# Patient Record
Sex: Female | Born: 1975
Health system: Southern US, Community
[De-identification: ages and names within clinical notes are randomized; demographics above are authoritative.]

## PROBLEM LIST (undated history)

## (undated) DIAGNOSIS — E119 Type 2 diabetes mellitus without complications: Secondary | ICD-10-CM

## (undated) DIAGNOSIS — M419 Scoliosis, unspecified: Secondary | ICD-10-CM

## (undated) HISTORY — DX: Type 2 diabetes mellitus without complications: E11.9

## (undated) HISTORY — DX: Scoliosis, unspecified: M41.9

---

## 2009-03-20 DIAGNOSIS — E785 Hyperlipidemia, unspecified: Secondary | ICD-10-CM | POA: Insufficient documentation

## 2009-03-20 DIAGNOSIS — I10 Essential (primary) hypertension: Secondary | ICD-10-CM | POA: Insufficient documentation

## 2011-03-24 DIAGNOSIS — B351 Tinea unguium: Secondary | ICD-10-CM | POA: Diagnosis not present

## 2011-03-24 DIAGNOSIS — M2040 Other hammer toe(s) (acquired), unspecified foot: Secondary | ICD-10-CM | POA: Diagnosis not present

## 2011-03-24 DIAGNOSIS — M79609 Pain in unspecified limb: Secondary | ICD-10-CM | POA: Diagnosis not present

## 2011-03-25 DIAGNOSIS — N058 Unspecified nephritic syndrome with other morphologic changes: Secondary | ICD-10-CM | POA: Diagnosis not present

## 2011-03-25 DIAGNOSIS — I10 Essential (primary) hypertension: Secondary | ICD-10-CM | POA: Diagnosis not present

## 2011-03-25 DIAGNOSIS — E785 Hyperlipidemia, unspecified: Secondary | ICD-10-CM | POA: Diagnosis not present

## 2011-03-25 DIAGNOSIS — G43709 Chronic migraine without aura, not intractable, without status migrainosus: Secondary | ICD-10-CM | POA: Insufficient documentation

## 2011-05-30 DIAGNOSIS — Z3040 Encounter for surveillance of contraceptives, unspecified: Secondary | ICD-10-CM | POA: Diagnosis not present

## 2011-05-31 DIAGNOSIS — N058 Unspecified nephritic syndrome with other morphologic changes: Secondary | ICD-10-CM | POA: Diagnosis not present

## 2011-05-31 DIAGNOSIS — R51 Headache: Secondary | ICD-10-CM | POA: Diagnosis not present

## 2011-05-31 DIAGNOSIS — E785 Hyperlipidemia, unspecified: Secondary | ICD-10-CM | POA: Diagnosis not present

## 2011-07-07 DIAGNOSIS — E1149 Type 2 diabetes mellitus with other diabetic neurological complication: Secondary | ICD-10-CM | POA: Diagnosis not present

## 2011-07-07 DIAGNOSIS — Q828 Other specified congenital malformations of skin: Secondary | ICD-10-CM | POA: Diagnosis not present

## 2011-07-07 DIAGNOSIS — L608 Other nail disorders: Secondary | ICD-10-CM | POA: Diagnosis not present

## 2011-08-18 DIAGNOSIS — Z3040 Encounter for surveillance of contraceptives, unspecified: Secondary | ICD-10-CM | POA: Diagnosis not present

## 2011-09-05 DIAGNOSIS — E1129 Type 2 diabetes mellitus with other diabetic kidney complication: Secondary | ICD-10-CM | POA: Diagnosis not present

## 2011-09-05 DIAGNOSIS — R51 Headache: Secondary | ICD-10-CM | POA: Diagnosis not present

## 2011-09-05 DIAGNOSIS — F419 Anxiety disorder, unspecified: Secondary | ICD-10-CM | POA: Insufficient documentation

## 2011-09-05 DIAGNOSIS — N058 Unspecified nephritic syndrome with other morphologic changes: Secondary | ICD-10-CM | POA: Diagnosis not present

## 2011-09-05 DIAGNOSIS — Z23 Encounter for immunization: Secondary | ICD-10-CM | POA: Diagnosis not present

## 2011-09-05 DIAGNOSIS — E785 Hyperlipidemia, unspecified: Secondary | ICD-10-CM | POA: Diagnosis not present

## 2011-11-11 DIAGNOSIS — Z3040 Encounter for surveillance of contraceptives, unspecified: Secondary | ICD-10-CM | POA: Diagnosis not present

## 2012-01-12 DIAGNOSIS — H53009 Unspecified amblyopia, unspecified eye: Secondary | ICD-10-CM | POA: Diagnosis not present

## 2012-01-12 DIAGNOSIS — E119 Type 2 diabetes mellitus without complications: Secondary | ICD-10-CM | POA: Diagnosis not present

## 2012-01-22 DIAGNOSIS — B86 Scabies: Secondary | ICD-10-CM | POA: Diagnosis not present

## 2012-02-08 DIAGNOSIS — Z23 Encounter for immunization: Secondary | ICD-10-CM | POA: Diagnosis not present

## 2012-02-08 DIAGNOSIS — Z3040 Encounter for surveillance of contraceptives, unspecified: Secondary | ICD-10-CM | POA: Diagnosis not present

## 2012-02-26 DIAGNOSIS — J309 Allergic rhinitis, unspecified: Secondary | ICD-10-CM | POA: Diagnosis not present

## 2012-03-29 DIAGNOSIS — Q828 Other specified congenital malformations of skin: Secondary | ICD-10-CM | POA: Diagnosis not present

## 2012-03-29 DIAGNOSIS — L608 Other nail disorders: Secondary | ICD-10-CM | POA: Diagnosis not present

## 2012-03-29 DIAGNOSIS — E1149 Type 2 diabetes mellitus with other diabetic neurological complication: Secondary | ICD-10-CM | POA: Diagnosis not present

## 2012-04-03 DIAGNOSIS — I10 Essential (primary) hypertension: Secondary | ICD-10-CM | POA: Diagnosis not present

## 2012-04-03 DIAGNOSIS — F79 Unspecified intellectual disabilities: Secondary | ICD-10-CM | POA: Diagnosis not present

## 2012-04-03 DIAGNOSIS — E119 Type 2 diabetes mellitus without complications: Secondary | ICD-10-CM | POA: Diagnosis not present

## 2012-04-03 DIAGNOSIS — E1129 Type 2 diabetes mellitus with other diabetic kidney complication: Secondary | ICD-10-CM | POA: Diagnosis not present

## 2012-04-03 DIAGNOSIS — E785 Hyperlipidemia, unspecified: Secondary | ICD-10-CM | POA: Diagnosis not present

## 2012-06-14 DIAGNOSIS — I10 Essential (primary) hypertension: Secondary | ICD-10-CM | POA: Diagnosis not present

## 2012-06-14 DIAGNOSIS — N058 Unspecified nephritic syndrome with other morphologic changes: Secondary | ICD-10-CM | POA: Diagnosis not present

## 2012-06-14 DIAGNOSIS — E1129 Type 2 diabetes mellitus with other diabetic kidney complication: Secondary | ICD-10-CM | POA: Diagnosis not present

## 2012-06-21 DIAGNOSIS — Q828 Other specified congenital malformations of skin: Secondary | ICD-10-CM | POA: Diagnosis not present

## 2012-06-21 DIAGNOSIS — L608 Other nail disorders: Secondary | ICD-10-CM | POA: Diagnosis not present

## 2012-06-21 DIAGNOSIS — E1149 Type 2 diabetes mellitus with other diabetic neurological complication: Secondary | ICD-10-CM | POA: Diagnosis not present

## 2012-07-05 DIAGNOSIS — H6120 Impacted cerumen, unspecified ear: Secondary | ICD-10-CM | POA: Diagnosis not present

## 2012-07-05 DIAGNOSIS — M25519 Pain in unspecified shoulder: Secondary | ICD-10-CM | POA: Diagnosis not present

## 2012-07-26 DIAGNOSIS — I10 Essential (primary) hypertension: Secondary | ICD-10-CM | POA: Diagnosis not present

## 2012-07-26 DIAGNOSIS — E1129 Type 2 diabetes mellitus with other diabetic kidney complication: Secondary | ICD-10-CM | POA: Diagnosis not present

## 2012-07-26 DIAGNOSIS — N058 Unspecified nephritic syndrome with other morphologic changes: Secondary | ICD-10-CM | POA: Diagnosis not present

## 2012-08-24 DIAGNOSIS — Z01419 Encounter for gynecological examination (general) (routine) without abnormal findings: Secondary | ICD-10-CM | POA: Diagnosis not present

## 2012-09-13 DIAGNOSIS — E1129 Type 2 diabetes mellitus with other diabetic kidney complication: Secondary | ICD-10-CM | POA: Diagnosis not present

## 2012-09-13 DIAGNOSIS — I10 Essential (primary) hypertension: Secondary | ICD-10-CM | POA: Diagnosis not present

## 2012-09-13 DIAGNOSIS — N058 Unspecified nephritic syndrome with other morphologic changes: Secondary | ICD-10-CM | POA: Diagnosis not present

## 2012-09-13 DIAGNOSIS — Z6837 Body mass index (BMI) 37.0-37.9, adult: Secondary | ICD-10-CM | POA: Diagnosis not present

## 2012-09-24 DIAGNOSIS — Z3040 Encounter for surveillance of contraceptives, unspecified: Secondary | ICD-10-CM | POA: Diagnosis not present

## 2012-10-09 ENCOUNTER — Other Ambulatory Visit: Payer: Self-pay | Admitting: Endocrinology

## 2012-10-09 DIAGNOSIS — N644 Mastodynia: Secondary | ICD-10-CM

## 2012-10-09 DIAGNOSIS — Z6837 Body mass index (BMI) 37.0-37.9, adult: Secondary | ICD-10-CM | POA: Diagnosis not present

## 2012-10-09 DIAGNOSIS — L84 Corns and callosities: Secondary | ICD-10-CM | POA: Diagnosis not present

## 2012-10-09 DIAGNOSIS — E1129 Type 2 diabetes mellitus with other diabetic kidney complication: Secondary | ICD-10-CM | POA: Diagnosis not present

## 2012-10-09 DIAGNOSIS — N058 Unspecified nephritic syndrome with other morphologic changes: Secondary | ICD-10-CM | POA: Diagnosis not present

## 2012-10-09 DIAGNOSIS — E785 Hyperlipidemia, unspecified: Secondary | ICD-10-CM | POA: Diagnosis not present

## 2012-10-09 DIAGNOSIS — G43009 Migraine without aura, not intractable, without status migrainosus: Secondary | ICD-10-CM | POA: Diagnosis not present

## 2012-10-09 DIAGNOSIS — I10 Essential (primary) hypertension: Secondary | ICD-10-CM | POA: Diagnosis not present

## 2012-10-24 ENCOUNTER — Ambulatory Visit
Admission: RE | Admit: 2012-10-24 | Discharge: 2012-10-24 | Disposition: A | Discharge status: Home / Self Care | Payer: Medicare Other | Source: Ambulatory Visit | Attending: Endocrinology | Admitting: Endocrinology

## 2012-10-24 DIAGNOSIS — N644 Mastodynia: Secondary | ICD-10-CM

## 2012-11-14 DIAGNOSIS — Z23 Encounter for immunization: Secondary | ICD-10-CM | POA: Diagnosis not present

## 2012-11-14 DIAGNOSIS — G43009 Migraine without aura, not intractable, without status migrainosus: Secondary | ICD-10-CM | POA: Diagnosis not present

## 2012-11-14 DIAGNOSIS — E1129 Type 2 diabetes mellitus with other diabetic kidney complication: Secondary | ICD-10-CM | POA: Diagnosis not present

## 2012-11-14 DIAGNOSIS — I10 Essential (primary) hypertension: Secondary | ICD-10-CM | POA: Diagnosis not present

## 2012-11-14 DIAGNOSIS — N058 Unspecified nephritic syndrome with other morphologic changes: Secondary | ICD-10-CM | POA: Diagnosis not present

## 2012-12-20 DIAGNOSIS — B373 Candidiasis of vulva and vagina: Secondary | ICD-10-CM | POA: Diagnosis not present

## 2012-12-26 DIAGNOSIS — Z6836 Body mass index (BMI) 36.0-36.9, adult: Secondary | ICD-10-CM | POA: Diagnosis not present

## 2012-12-26 DIAGNOSIS — R197 Diarrhea, unspecified: Secondary | ICD-10-CM | POA: Diagnosis not present

## 2012-12-26 DIAGNOSIS — I10 Essential (primary) hypertension: Secondary | ICD-10-CM | POA: Diagnosis not present

## 2012-12-26 DIAGNOSIS — E1129 Type 2 diabetes mellitus with other diabetic kidney complication: Secondary | ICD-10-CM | POA: Diagnosis not present

## 2012-12-26 DIAGNOSIS — N058 Unspecified nephritic syndrome with other morphologic changes: Secondary | ICD-10-CM | POA: Diagnosis not present

## 2013-01-28 DIAGNOSIS — N058 Unspecified nephritic syndrome with other morphologic changes: Secondary | ICD-10-CM | POA: Diagnosis not present

## 2013-01-28 DIAGNOSIS — G43009 Migraine without aura, not intractable, without status migrainosus: Secondary | ICD-10-CM | POA: Diagnosis not present

## 2013-01-28 DIAGNOSIS — E1129 Type 2 diabetes mellitus with other diabetic kidney complication: Secondary | ICD-10-CM | POA: Diagnosis not present

## 2013-01-28 DIAGNOSIS — F341 Dysthymic disorder: Secondary | ICD-10-CM | POA: Diagnosis not present

## 2013-01-28 DIAGNOSIS — E785 Hyperlipidemia, unspecified: Secondary | ICD-10-CM | POA: Diagnosis not present

## 2013-01-28 DIAGNOSIS — I10 Essential (primary) hypertension: Secondary | ICD-10-CM | POA: Diagnosis not present

## 2013-01-28 DIAGNOSIS — L84 Corns and callosities: Secondary | ICD-10-CM | POA: Diagnosis not present

## 2013-01-28 DIAGNOSIS — F79 Unspecified intellectual disabilities: Secondary | ICD-10-CM | POA: Diagnosis not present

## 2013-03-12 DIAGNOSIS — E1129 Type 2 diabetes mellitus with other diabetic kidney complication: Secondary | ICD-10-CM | POA: Diagnosis not present

## 2013-03-12 DIAGNOSIS — N058 Unspecified nephritic syndrome with other morphologic changes: Secondary | ICD-10-CM | POA: Diagnosis not present

## 2013-03-12 DIAGNOSIS — I10 Essential (primary) hypertension: Secondary | ICD-10-CM | POA: Diagnosis not present

## 2013-03-19 DIAGNOSIS — J310 Chronic rhinitis: Secondary | ICD-10-CM | POA: Diagnosis not present

## 2013-04-11 DIAGNOSIS — E119 Type 2 diabetes mellitus without complications: Secondary | ICD-10-CM | POA: Diagnosis not present

## 2013-04-11 DIAGNOSIS — M4120 Other idiopathic scoliosis, site unspecified: Secondary | ICD-10-CM | POA: Diagnosis not present

## 2013-04-11 DIAGNOSIS — R079 Chest pain, unspecified: Secondary | ICD-10-CM | POA: Diagnosis not present

## 2013-04-16 DIAGNOSIS — R079 Chest pain, unspecified: Secondary | ICD-10-CM | POA: Diagnosis not present

## 2013-05-07 DIAGNOSIS — K5900 Constipation, unspecified: Secondary | ICD-10-CM | POA: Diagnosis not present

## 2013-05-07 DIAGNOSIS — R109 Unspecified abdominal pain: Secondary | ICD-10-CM | POA: Diagnosis not present

## 2013-05-07 DIAGNOSIS — M4120 Other idiopathic scoliosis, site unspecified: Secondary | ICD-10-CM | POA: Diagnosis not present

## 2013-05-07 DIAGNOSIS — H6120 Impacted cerumen, unspecified ear: Secondary | ICD-10-CM | POA: Diagnosis not present

## 2013-05-08 DIAGNOSIS — R142 Eructation: Secondary | ICD-10-CM | POA: Diagnosis not present

## 2013-05-08 DIAGNOSIS — R141 Gas pain: Secondary | ICD-10-CM | POA: Diagnosis not present

## 2013-05-08 DIAGNOSIS — K5900 Constipation, unspecified: Secondary | ICD-10-CM | POA: Diagnosis not present

## 2013-05-08 DIAGNOSIS — M47814 Spondylosis without myelopathy or radiculopathy, thoracic region: Secondary | ICD-10-CM | POA: Diagnosis not present

## 2013-05-08 DIAGNOSIS — M545 Low back pain, unspecified: Secondary | ICD-10-CM | POA: Diagnosis not present

## 2013-05-28 DIAGNOSIS — Z6836 Body mass index (BMI) 36.0-36.9, adult: Secondary | ICD-10-CM | POA: Diagnosis not present

## 2013-05-28 DIAGNOSIS — F341 Dysthymic disorder: Secondary | ICD-10-CM | POA: Diagnosis not present

## 2013-05-28 DIAGNOSIS — F79 Unspecified intellectual disabilities: Secondary | ICD-10-CM | POA: Diagnosis not present

## 2013-05-28 DIAGNOSIS — N058 Unspecified nephritic syndrome with other morphologic changes: Secondary | ICD-10-CM | POA: Diagnosis not present

## 2013-05-28 DIAGNOSIS — E1129 Type 2 diabetes mellitus with other diabetic kidney complication: Secondary | ICD-10-CM | POA: Diagnosis not present

## 2013-06-11 DIAGNOSIS — K5900 Constipation, unspecified: Secondary | ICD-10-CM | POA: Diagnosis not present

## 2013-06-11 DIAGNOSIS — B354 Tinea corporis: Secondary | ICD-10-CM | POA: Diagnosis not present

## 2013-06-20 ENCOUNTER — Ambulatory Visit (INDEPENDENT_AMBULATORY_CARE_PROVIDER_SITE_OTHER): Payer: Medicare Other

## 2013-06-20 VITALS — BP 124/81 | HR 96 | Resp 18

## 2013-06-20 DIAGNOSIS — E1149 Type 2 diabetes mellitus with other diabetic neurological complication: Secondary | ICD-10-CM | POA: Diagnosis not present

## 2013-06-20 DIAGNOSIS — Q828 Other specified congenital malformations of skin: Secondary | ICD-10-CM

## 2013-06-20 DIAGNOSIS — L608 Other nail disorders: Secondary | ICD-10-CM | POA: Diagnosis not present

## 2013-06-20 DIAGNOSIS — E1140 Type 2 diabetes mellitus with diabetic neuropathy, unspecified: Secondary | ICD-10-CM

## 2013-06-20 NOTE — Progress Notes (Signed)
   Subjective:    Patient ID: Sara Chung, female    DOB: 11/23/1975, 38 y.o.   MRN: [id number]  HPI Trim my nails and callus on my right foot and I need some new shoes    Review of Systems  Gastrointestinal: Positive for constipation.  All other systems reviewed and are negative.      Objective:   Physical Exam Vascular status is intact with pedal pulses palpable DP postal for PT plus one over 4 Refill time 3 seconds epicritic and proprioceptive sensations intact and symmetric bilateral diminished sensation to the toes and plantar forefoot there is diffuse keratoses sub-fifth metatarsal bilateral with punctate keratoses sub-fifth right which is debrided at this time. Patient's K. for diabetic accident shoes need replacing at this time will get authorizations were primary physician contact patient when shoe authorization received for casting and molding incising. The other wounds ulcerations mild flexible digital contractures are noted one through 5 bilateral       Assessment & Plan:  Assessment diabetes with peripheral neuropathy multiple dystrophic from criptotic nails are debrided and presence of diabetes and complications at this time return for future palliative nail care is needed also reducing keratotic lesion sub-5 right  Harriet Masson DPM

## 2013-06-20 NOTE — Patient Instructions (Signed)
Diabetes and Foot Care Diabetes may cause you to have problems because of poor blood supply (circulation) to your feet and legs. This may cause the skin on your feet to become thinner, break easier, and heal more slowly. Your skin may become dry, and the skin may peel and crack. You may also have nerve damage in your legs and feet causing decreased feeling in them. You may not notice minor injuries to your feet that could lead to infections or more serious problems. Taking care of your feet is one of the most important things you can do for yourself.  HOME CARE INSTRUCTIONS  Wear shoes at all times, even in the house. Do not go barefoot. Bare feet are easily injured.  Check your feet daily for blisters, cuts, and redness. If you cannot see the bottom of your feet, use a mirror or ask someone for help.  Wash your feet with warm water (do not use hot water) and mild soap. Then pat your feet and the areas between your toes until they are completely dry. Do not soak your feet as this can dry your skin.  Apply a moisturizing lotion or petroleum jelly (that does not contain alcohol and is unscented) to the skin on your feet and to dry, brittle toenails. Do not apply lotion between your toes.  Trim your toenails straight across. Do not dig under them or around the cuticle. File the edges of your nails with an emery board or nail file.  Do not cut corns or calluses or try to remove them with medicine.  Wear clean socks or stockings every day. Make sure they are not too tight. Do not wear knee-high stockings since they may decrease blood flow to your legs.  Wear shoes that fit properly and have enough cushioning. To break in new shoes, wear them for just a few hours a day. This prevents you from injuring your feet. Always look in your shoes before you put them on to be sure there are no objects inside.  Do not cross your legs. This may decrease the blood flow to your feet.  If you find a minor scrape,  cut, or break in the skin on your feet, keep it and the skin around it clean and dry. These areas may be cleansed with mild soap and water. Do not cleanse the area with peroxide, alcohol, or iodine.  When you remove an adhesive bandage, be sure not to damage the skin around it.  If you have a wound, look at it several times a day to make sure it is healing.  Do not use heating pads or hot water bottles. They may burn your skin. If you have lost feeling in your feet or legs, you may not know it is happening until it is too late.  Make sure your health care provider performs a complete foot exam at least annually or more often if you have foot problems. Report any cuts, sores, or bruises to your health care provider immediately. SEEK MEDICAL CARE IF:   You have an injury that is not healing.  You have cuts or breaks in the skin.  You have an ingrown nail.  You notice redness on your legs or feet.  You feel burning or tingling in your legs or feet.  You have pain or cramps in your legs and feet.  Your legs or feet are numb.  Your feet always feel cold. SEEK IMMEDIATE MEDICAL CARE IF:   There is increasing redness,   swelling, or pain in or around a wound.  There is a red line that goes up your leg.  Pus is coming from a wound.  You develop a fever or as directed by your health care provider.  You notice a bad smell coming from an ulcer or wound. Document Released: 02/19/2000 Document Revised: 10/24/2012 Document Reviewed: 07/31/2012 Exit[employer] Patient Information 2014 [employer].  

## 2013-07-05 DIAGNOSIS — J0100 Acute maxillary sinusitis, unspecified: Secondary | ICD-10-CM | POA: Diagnosis not present

## 2013-07-05 DIAGNOSIS — J309 Allergic rhinitis, unspecified: Secondary | ICD-10-CM | POA: Diagnosis not present

## 2013-08-15 DIAGNOSIS — R109 Unspecified abdominal pain: Secondary | ICD-10-CM | POA: Diagnosis not present

## 2013-08-19 DIAGNOSIS — R109 Unspecified abdominal pain: Secondary | ICD-10-CM | POA: Diagnosis not present

## 2013-08-29 DIAGNOSIS — J069 Acute upper respiratory infection, unspecified: Secondary | ICD-10-CM | POA: Diagnosis not present

## 2013-08-29 DIAGNOSIS — L988 Other specified disorders of the skin and subcutaneous tissue: Secondary | ICD-10-CM | POA: Diagnosis not present

## 2013-08-30 DIAGNOSIS — N1330 Unspecified hydronephrosis: Secondary | ICD-10-CM | POA: Diagnosis not present

## 2013-08-30 DIAGNOSIS — N390 Urinary tract infection, site not specified: Secondary | ICD-10-CM | POA: Diagnosis not present

## 2013-08-30 DIAGNOSIS — N281 Cyst of kidney, acquired: Secondary | ICD-10-CM | POA: Diagnosis not present

## 2013-09-10 DIAGNOSIS — F79 Unspecified intellectual disabilities: Secondary | ICD-10-CM | POA: Diagnosis not present

## 2013-09-10 DIAGNOSIS — E1129 Type 2 diabetes mellitus with other diabetic kidney complication: Secondary | ICD-10-CM | POA: Diagnosis not present

## 2013-09-10 DIAGNOSIS — N058 Unspecified nephritic syndrome with other morphologic changes: Secondary | ICD-10-CM | POA: Diagnosis not present

## 2013-09-10 DIAGNOSIS — Z6836 Body mass index (BMI) 36.0-36.9, adult: Secondary | ICD-10-CM | POA: Diagnosis not present

## 2013-09-11 DIAGNOSIS — N269 Renal sclerosis, unspecified: Secondary | ICD-10-CM | POA: Diagnosis not present

## 2013-09-11 DIAGNOSIS — N289 Disorder of kidney and ureter, unspecified: Secondary | ICD-10-CM | POA: Diagnosis not present

## 2013-09-19 DIAGNOSIS — E119 Type 2 diabetes mellitus without complications: Secondary | ICD-10-CM | POA: Diagnosis not present

## 2013-09-26 ENCOUNTER — Ambulatory Visit: Payer: Medicare Other

## 2013-09-27 DIAGNOSIS — R51 Headache: Secondary | ICD-10-CM | POA: Diagnosis not present

## 2013-09-27 DIAGNOSIS — L84 Corns and callosities: Secondary | ICD-10-CM | POA: Diagnosis not present

## 2013-09-27 DIAGNOSIS — E1129 Type 2 diabetes mellitus with other diabetic kidney complication: Secondary | ICD-10-CM | POA: Diagnosis not present

## 2013-09-27 DIAGNOSIS — I10 Essential (primary) hypertension: Secondary | ICD-10-CM | POA: Diagnosis not present

## 2013-09-27 DIAGNOSIS — F79 Unspecified intellectual disabilities: Secondary | ICD-10-CM | POA: Diagnosis not present

## 2013-09-27 DIAGNOSIS — E785 Hyperlipidemia, unspecified: Secondary | ICD-10-CM | POA: Diagnosis not present

## 2013-09-27 DIAGNOSIS — N058 Unspecified nephritic syndrome with other morphologic changes: Secondary | ICD-10-CM | POA: Diagnosis not present

## 2013-09-27 DIAGNOSIS — F341 Dysthymic disorder: Secondary | ICD-10-CM | POA: Diagnosis not present

## 2013-10-10 ENCOUNTER — Ambulatory Visit (INDEPENDENT_AMBULATORY_CARE_PROVIDER_SITE_OTHER): Payer: Medicare Other

## 2013-10-10 VITALS — BP 113/84 | HR 95 | Resp 18

## 2013-10-10 DIAGNOSIS — L0591 Pilonidal cyst without abscess: Secondary | ICD-10-CM | POA: Diagnosis not present

## 2013-10-10 DIAGNOSIS — E1149 Type 2 diabetes mellitus with other diabetic neurological complication: Secondary | ICD-10-CM | POA: Diagnosis not present

## 2013-10-10 DIAGNOSIS — L608 Other nail disorders: Secondary | ICD-10-CM | POA: Diagnosis not present

## 2013-10-10 DIAGNOSIS — Q828 Other specified congenital malformations of skin: Secondary | ICD-10-CM

## 2013-10-10 DIAGNOSIS — E1140 Type 2 diabetes mellitus with diabetic neuropathy, unspecified: Secondary | ICD-10-CM

## 2013-10-10 NOTE — Patient Instructions (Signed)
Diabetes and Foot Care Diabetes may cause you to have problems because of poor blood supply (circulation) to your feet and legs. This may cause the skin on your feet to become thinner, break easier, and heal more slowly. Your skin may become dry, and the skin may peel and crack. You may also have nerve damage in your legs and feet causing decreased feeling in them. You may not notice minor injuries to your feet that could lead to infections or more serious problems. Taking care of your feet is one of the most important things you can do for yourself.  HOME CARE INSTRUCTIONS  Wear shoes at all times, even in the house. Do not go barefoot. Bare feet are easily injured.  Check your feet daily for blisters, cuts, and redness. If you cannot see the bottom of your feet, use a mirror or ask someone for help.  Wash your feet with warm water (do not use hot water) and mild soap. Then pat your feet and the areas between your toes until they are completely dry. Do not soak your feet as this can dry your skin.  Apply a moisturizing lotion or petroleum jelly (that does not contain alcohol and is unscented) to the skin on your feet and to dry, brittle toenails. Do not apply lotion between your toes.  Trim your toenails straight across. Do not dig under them or around the cuticle. File the edges of your nails with an emery board or nail file.  Do not cut corns or calluses or try to remove them with medicine.  Wear clean socks or stockings every day. Make sure they are not too tight. Do not wear knee-high stockings since they may decrease blood flow to your legs.  Wear shoes that fit properly and have enough cushioning. To break in new shoes, wear them for just a few hours a day. This prevents you from injuring your feet. Always look in your shoes before you put them on to be sure there are no objects inside.  Do not cross your legs. This may decrease the blood flow to your feet.  If you find a minor scrape,  cut, or break in the skin on your feet, keep it and the skin around it clean and dry. These areas may be cleansed with mild soap and water. Do not cleanse the area with peroxide, alcohol, or iodine.  When you remove an adhesive bandage, be sure not to damage the skin around it.  If you have a wound, look at it several times a day to make sure it is healing.  Do not use heating pads or hot water bottles. They may burn your skin. If you have lost feeling in your feet or legs, you may not know it is happening until it is too late.  Make sure your health care provider performs a complete foot exam at least annually or more often if you have foot problems. Report any cuts, sores, or bruises to your health care provider immediately. SEEK MEDICAL CARE IF:   You have an injury that is not healing.  You have cuts or breaks in the skin.  You have an ingrown nail.  You notice redness on your legs or feet.  You feel burning or tingling in your legs or feet.  You have pain or cramps in your legs and feet.  Your legs or feet are numb.  Your feet always feel cold. SEEK IMMEDIATE MEDICAL CARE IF:   There is increasing redness,   swelling, or pain in or around a wound.  There is a red line that goes up your leg.  Pus is coming from a wound.  You develop a fever or as directed by your health care provider.  You notice a bad smell coming from an ulcer or wound. Document Released: 02/19/2000 Document Revised: 10/24/2012 Document Reviewed: 07/31/2012 Exit[employer] Patient Information 2015 [employer]. This information is not intended to replace advice given to you by your health care provider. Make sure you discuss any questions you have with your health care provider.  

## 2013-10-10 NOTE — Progress Notes (Signed)
° °  Subjective:    Patient ID: Sara Chung, female    DOB: [birth year], 38 y.o.   MRN: [id number]  HPI I AM HERE TO GET MY TOENAILS TRIMMED UP AND TO GET MEASURED FOR SHOES    Review of Systems no new findings or systemic changes noted recent visit with Dr. Forde Dandy for her diabetes reveal the A1c of 6.5. It is best reading she's never had.     Objective:   Physical Exam Partially objective findings as follows vascular status is intact with pedal pulses palpable DP +2/4 PT one over 4 bilateral Refill time 3 seconds all digits. Epicritic and proprioceptive sensations intact although diminished to the toes and forefoot and arch. There is keratoses sub-fifth right and sub-second third metatarsal bilateral. No open wounds ulcerations no secondary infection is noted nail somewhat criptotic incurvated friable hallux nails previously had partial avulsions borders no smoking one nail to current time. Authorization for diabetic shoes and been obtained and measurements and Boffo impressions are carried out at this time patient be contacted when refer shoe fitting and dispensing. Otherwise recommended 3 month followup for palliative nail       Assessment & Plan:  Assessment diabetes with peripheral neuropathy. History keratoses as well as thick dystrophic friable nails 1 through 5 bilateral nails are debrided x10 at this time also debridement of multiple keratoses return for future palliative care in 3 months followup with him next month when shoes are ready for fitting and dispensing next  [employer] DPM

## 2013-11-15 DIAGNOSIS — Z309 Encounter for contraceptive management, unspecified: Secondary | ICD-10-CM | POA: Diagnosis not present

## 2013-11-15 DIAGNOSIS — M25519 Pain in unspecified shoulder: Secondary | ICD-10-CM | POA: Diagnosis not present

## 2013-12-09 ENCOUNTER — Ambulatory Visit (INDEPENDENT_AMBULATORY_CARE_PROVIDER_SITE_OTHER): Payer: Medicare Other

## 2013-12-09 VITALS — BP 116/86 | HR 82 | Resp 12

## 2013-12-09 DIAGNOSIS — Q828 Other specified congenital malformations of skin: Secondary | ICD-10-CM

## 2013-12-09 DIAGNOSIS — M2040 Other hammer toe(s) (acquired), unspecified foot: Secondary | ICD-10-CM

## 2013-12-09 DIAGNOSIS — E1140 Type 2 diabetes mellitus with diabetic neuropathy, unspecified: Secondary | ICD-10-CM

## 2013-12-09 NOTE — Patient Instructions (Signed)
Diabetes and Exercise Exercising regularly is important. It is not just about losing weight. It has many health benefits, such as:  Improving your overall fitness, flexibility, and endurance.  Increasing your bone density.  Helping with weight control.  Decreasing your body fat.  Increasing your muscle strength.  Reducing stress and tension.  Improving your overall health. People with diabetes who exercise gain additional benefits because exercise:  Reduces appetite.  Improves the body's use of blood sugar (glucose).  Helps lower or control blood glucose.  Decreases blood pressure.  Helps control blood lipids (such as cholesterol and triglycerides).  Improves the body's use of the hormone insulin by:  Increasing the body's insulin sensitivity.  Reducing the body's insulin needs.  Decreases the risk for heart disease because exercising:  Lowers cholesterol and triglycerides levels.  Increases the levels of good cholesterol (such as high-density lipoproteins [HDL]) in the body.  Lowers blood glucose levels. YOUR ACTIVITY PLAN  Choose an activity that you enjoy and set realistic goals. Your health care provider or diabetes educator can help you make an activity plan that works for you. Exercise regularly as directed by your health care provider. This includes:  Performing resistance training twice a week such as push-ups, sit-ups, lifting weights, or using resistance bands.  Performing 150 minutes of cardio exercises each week such as walking, running, or playing sports.  Staying active and spending no more than 90 minutes at one time being inactive. Even short bursts of exercise are good for you. Three 10-minute sessions spread throughout the day are just as beneficial as a single 30-minute session. Some exercise ideas include:  Taking the dog for a walk.  Taking the stairs instead of the elevator.  Dancing to your favorite song.  Doing an exercise  video.  Doing your favorite exercise with a friend. RECOMMENDATIONS FOR EXERCISING WITH TYPE 1 OR TYPE 2 DIABETES   Check your blood glucose before exercising. If blood glucose levels are greater than 240 mg/dL, check for urine ketones. Do not exercise if ketones are present.  Avoid injecting insulin into areas of the body that are going to be exercised. For example, avoid injecting insulin into:  The arms when playing tennis.  The legs when jogging.  Keep a record of:  Food intake before and after you exercise.  Expected peak times of insulin action.  Blood glucose levels before and after you exercise.  The type and amount of exercise you have done.  Review your records with your health care provider. Your health care provider will help you to develop guidelines for adjusting food intake and insulin amounts before and after exercising.  If you take insulin or oral hypoglycemic agents, watch for signs and symptoms of hypoglycemia. They include:  Dizziness.  Shaking.  Sweating.  Chills.  Confusion.  Drink plenty of water while you exercise to prevent dehydration or heat stroke. Body water is lost during exercise and must be replaced.  Talk to your health care provider before starting an exercise program to make sure it is safe for you. Remember, almost any type of activity is better than none. Document Released: 05/14/2003 Document Revised: 07/08/2013 Document Reviewed: 07/31/2012 ExitCare Patient Information 2015 [employer]. This information is not intended to replace advice given to you by your health care provider. Make sure you discuss any questions you have with your health care provider.  

## 2013-12-09 NOTE — Progress Notes (Signed)
   Subjective:    Patient ID: Sara Chung, female    DOB: [birth year], 38 y.o.   MRN: [id number]  HPI  CALLUS NEED TO BE TRIM.  DISPENSED DIABETIC SHOES WITH 3 PAIR CUSTOM MADE INSERTS.  Review of Systems     Objective:   Physical Exam        Assessment & Plan:

## 2013-12-09 NOTE — Progress Notes (Signed)
   Subjective:    Patient ID: Sara Chung, female    DOB: 10-Oct-1975, 38 y.o.   MRN: [id number]  HPI patient presents at this time for pickup of diabetic extra-depth shoes. Is also concerned about callus under the fifth right    Review of Systems no new findings or systemic changes noted     Objective:   Physical Exam Vascular status is intact DP +2 PT plus one over 4 bilateral capillary refill 3 seconds decreased sensation confirmed with digital contractures is plantarflexed metatarsal with keratoses sub-5 right no hemorrhage no open wounds or ulcers no secondary infection. Patient did for nail care and palliative footcare oh over month from now I was contacted in her shoes ready for fitting and dispensing at this time. 1 pair of diabetic accident shoes and 3 pairs of custom molded dual density Plastizote inlays are dispensed inlays the shoes fit and contour well full contact to the foot patient given shoes instructed in break-in wear oral and written instructions are given at this time.       Assessment & Plan:  Assessment diabetes with history peripheral neuropathy of keratoses sub-5 right the keratotic lesion is debrided although no infection is noted 1 pair shoes are dispensed 3 pairs of insoles which fit and contour to the patient's foot instructions are given recheck with the next month or 2 for continued palliative care in the future as needed patient has been diabetic shoes in the past discontinue provided relief from her keratoses which or not as severe as it had previously been digital contractures with peripheral neuropathy and onychomycosis noted diabetes with peripheral neuropathy noted  Harriet Masson DPM

## 2013-12-10 DIAGNOSIS — Z23 Encounter for immunization: Secondary | ICD-10-CM | POA: Diagnosis not present

## 2013-12-10 DIAGNOSIS — E1129 Type 2 diabetes mellitus with other diabetic kidney complication: Secondary | ICD-10-CM | POA: Diagnosis not present

## 2013-12-10 DIAGNOSIS — F79 Unspecified intellectual disabilities: Secondary | ICD-10-CM | POA: Diagnosis not present

## 2014-01-10 ENCOUNTER — Ambulatory Visit: Payer: Medicare Other

## 2014-01-13 DIAGNOSIS — J0100 Acute maxillary sinusitis, unspecified: Secondary | ICD-10-CM | POA: Diagnosis not present

## 2014-02-04 DIAGNOSIS — I10 Essential (primary) hypertension: Secondary | ICD-10-CM | POA: Diagnosis not present

## 2014-02-04 DIAGNOSIS — E785 Hyperlipidemia, unspecified: Secondary | ICD-10-CM | POA: Diagnosis not present

## 2014-02-04 DIAGNOSIS — F79 Unspecified intellectual disabilities: Secondary | ICD-10-CM | POA: Diagnosis not present

## 2014-02-04 DIAGNOSIS — F418 Other specified anxiety disorders: Secondary | ICD-10-CM | POA: Diagnosis not present

## 2014-02-04 DIAGNOSIS — Z1389 Encounter for screening for other disorder: Secondary | ICD-10-CM | POA: Diagnosis not present

## 2014-02-04 DIAGNOSIS — R251 Tremor, unspecified: Secondary | ICD-10-CM | POA: Diagnosis not present

## 2014-02-04 DIAGNOSIS — N08 Glomerular disorders in diseases classified elsewhere: Secondary | ICD-10-CM | POA: Diagnosis not present

## 2014-02-04 DIAGNOSIS — E1129 Type 2 diabetes mellitus with other diabetic kidney complication: Secondary | ICD-10-CM | POA: Diagnosis not present

## 2014-02-04 DIAGNOSIS — R51 Headache: Secondary | ICD-10-CM | POA: Diagnosis not present

## 2014-02-10 DIAGNOSIS — M2660 Temporomandibular joint disorder, unspecified: Secondary | ICD-10-CM | POA: Diagnosis not present

## 2014-02-24 ENCOUNTER — Ambulatory Visit (INDEPENDENT_AMBULATORY_CARE_PROVIDER_SITE_OTHER): Payer: Medicare Other

## 2014-02-24 VITALS — BP 113/74 | HR 100 | Resp 12

## 2014-02-24 DIAGNOSIS — L603 Nail dystrophy: Secondary | ICD-10-CM | POA: Diagnosis not present

## 2014-02-24 DIAGNOSIS — E1140 Type 2 diabetes mellitus with diabetic neuropathy, unspecified: Secondary | ICD-10-CM | POA: Diagnosis not present

## 2014-02-24 DIAGNOSIS — M2040 Other hammer toe(s) (acquired), unspecified foot: Secondary | ICD-10-CM | POA: Diagnosis not present

## 2014-02-24 DIAGNOSIS — Q828 Other specified congenital malformations of skin: Secondary | ICD-10-CM

## 2014-02-24 NOTE — Progress Notes (Signed)
   Subjective:    Patient ID: Sara Chung, female    DOB: 1975-09-08, 38 y.o.   MRN: [id number]  HPI  TOENAILS AND RT FOOT LITTLE TOE THE CALLUS NEED TO BE TRIM.  Review of Systems no new findings or systemic changes noted     Objective:   Physical Exam Neurovascular status is intact pedal pulses are palpable DP +2 PT plus one over 4 Refill time 3 seconds patient does have thick criptotic incurvated nails and presence in the resume diabetes Applications also keratoses HD 5 left and pinch callus sub-fifth right no open wounds no ulcers no secondary infections. There is decreased sensation Semmes Weinstein testing.       Assessment & Plan:  Assessment diabetes with history peripheral neuropathy keratoses sub-5 right and HD 5 left multiple keratoses or debridement this time also multiple dystrophic friable gratified criptotic nails 1 through 5 bilateral Neosporin applied and a Band-Aid applied to the fifth toe left foot following debridement return in 3 months for continued palliative care in the future as needed  Harriet Masson DPM

## 2014-03-19 DIAGNOSIS — J069 Acute upper respiratory infection, unspecified: Secondary | ICD-10-CM | POA: Diagnosis not present

## 2014-04-02 DIAGNOSIS — I10 Essential (primary) hypertension: Secondary | ICD-10-CM | POA: Diagnosis not present

## 2014-04-02 DIAGNOSIS — E1129 Type 2 diabetes mellitus with other diabetic kidney complication: Secondary | ICD-10-CM | POA: Diagnosis not present

## 2014-04-02 DIAGNOSIS — Z6834 Body mass index (BMI) 34.0-34.9, adult: Secondary | ICD-10-CM | POA: Diagnosis not present

## 2014-04-25 DIAGNOSIS — R51 Headache: Secondary | ICD-10-CM | POA: Diagnosis not present

## 2014-04-25 DIAGNOSIS — R071 Chest pain on breathing: Secondary | ICD-10-CM | POA: Diagnosis not present

## 2014-04-25 DIAGNOSIS — E1165 Type 2 diabetes mellitus with hyperglycemia: Secondary | ICD-10-CM | POA: Diagnosis not present

## 2014-05-12 DIAGNOSIS — J00 Acute nasopharyngitis [common cold]: Secondary | ICD-10-CM | POA: Diagnosis not present

## 2014-05-12 DIAGNOSIS — Z3042 Encounter for surveillance of injectable contraceptive: Secondary | ICD-10-CM | POA: Diagnosis not present

## 2014-05-16 DIAGNOSIS — Z6834 Body mass index (BMI) 34.0-34.9, adult: Secondary | ICD-10-CM | POA: Diagnosis not present

## 2014-05-16 DIAGNOSIS — F79 Unspecified intellectual disabilities: Secondary | ICD-10-CM | POA: Diagnosis not present

## 2014-05-16 DIAGNOSIS — I10 Essential (primary) hypertension: Secondary | ICD-10-CM | POA: Diagnosis not present

## 2014-05-16 DIAGNOSIS — E785 Hyperlipidemia, unspecified: Secondary | ICD-10-CM | POA: Diagnosis not present

## 2014-05-16 DIAGNOSIS — F418 Other specified anxiety disorders: Secondary | ICD-10-CM | POA: Diagnosis not present

## 2014-05-16 DIAGNOSIS — N08 Glomerular disorders in diseases classified elsewhere: Secondary | ICD-10-CM | POA: Diagnosis not present

## 2014-05-16 DIAGNOSIS — E1129 Type 2 diabetes mellitus with other diabetic kidney complication: Secondary | ICD-10-CM | POA: Diagnosis not present

## 2014-05-26 ENCOUNTER — Ambulatory Visit: Payer: Medicare Other

## 2014-06-06 ENCOUNTER — Ambulatory Visit: Payer: Medicare Other

## 2014-06-12 DIAGNOSIS — R1032 Left lower quadrant pain: Secondary | ICD-10-CM | POA: Diagnosis not present

## 2014-06-19 ENCOUNTER — Ambulatory Visit (INDEPENDENT_AMBULATORY_CARE_PROVIDER_SITE_OTHER): Payer: Medicare Other | Admitting: Podiatrist

## 2014-06-19 ENCOUNTER — Encounter: Payer: Self-pay | Admitting: Podiatrist

## 2014-06-19 VITALS — BP 131/89 | HR 101 | Resp 18

## 2014-06-19 DIAGNOSIS — Q828 Other specified congenital malformations of skin: Secondary | ICD-10-CM

## 2014-06-19 DIAGNOSIS — M79609 Pain in unspecified limb: Secondary | ICD-10-CM

## 2014-06-19 DIAGNOSIS — E1140 Type 2 diabetes mellitus with diabetic neuropathy, unspecified: Secondary | ICD-10-CM

## 2014-06-19 DIAGNOSIS — M2040 Other hammer toe(s) (acquired), unspecified foot: Secondary | ICD-10-CM

## 2014-06-19 DIAGNOSIS — L603 Nail dystrophy: Secondary | ICD-10-CM

## 2014-06-19 DIAGNOSIS — B351 Tinea unguium: Secondary | ICD-10-CM | POA: Diagnosis not present

## 2014-06-19 DIAGNOSIS — M216X9 Other acquired deformities of unspecified foot: Secondary | ICD-10-CM

## 2014-06-19 NOTE — Progress Notes (Signed)
Chief Complaint  Patient presents with  . Nail Problem    I NEED MY NAILS TRIMMED UP AND TO GET DIABETIC SHOES        Objective:   Physical Exam Neurovascular status is intact pedal pulses are palpable DP +2 PT plus one over 4 Refill time 3 seconds patient does have thick criptotic incurvated nails and presence of diabetes.  HD 5 left and pinch callus sub-fifth right no open wounds no ulcers no secondary infections. There is decreased sensation Semmes Weinstein testing.       Assessment & Plan:  Assessment diabetes with history peripheral neuropathy keratoses sub-5 right and HD 5 left multiple keratoses   Nails are debrided without complication. Calluses are debrided. Without complication. She is a candidate for diabetic shoes due to risk of ulceration in the future and I'm referring her to [facility] to get these done for her this time. She'll be seen back for routine care.

## 2014-07-16 DIAGNOSIS — R109 Unspecified abdominal pain: Secondary | ICD-10-CM | POA: Diagnosis not present

## 2014-07-16 DIAGNOSIS — J209 Acute bronchitis, unspecified: Secondary | ICD-10-CM | POA: Diagnosis not present

## 2014-07-24 DIAGNOSIS — N08 Glomerular disorders in diseases classified elsewhere: Secondary | ICD-10-CM | POA: Diagnosis not present

## 2014-07-24 DIAGNOSIS — Z6833 Body mass index (BMI) 33.0-33.9, adult: Secondary | ICD-10-CM | POA: Diagnosis not present

## 2014-07-24 DIAGNOSIS — E1129 Type 2 diabetes mellitus with other diabetic kidney complication: Secondary | ICD-10-CM | POA: Diagnosis not present

## 2014-07-24 DIAGNOSIS — I10 Essential (primary) hypertension: Secondary | ICD-10-CM | POA: Diagnosis not present

## 2014-08-20 DIAGNOSIS — J00 Acute nasopharyngitis [common cold]: Secondary | ICD-10-CM | POA: Diagnosis not present

## 2014-09-17 DIAGNOSIS — Z6834 Body mass index (BMI) 34.0-34.9, adult: Secondary | ICD-10-CM | POA: Diagnosis not present

## 2014-09-17 DIAGNOSIS — Z1389 Encounter for screening for other disorder: Secondary | ICD-10-CM | POA: Diagnosis not present

## 2014-09-17 DIAGNOSIS — E1129 Type 2 diabetes mellitus with other diabetic kidney complication: Secondary | ICD-10-CM | POA: Diagnosis not present

## 2014-09-17 DIAGNOSIS — R51 Headache: Secondary | ICD-10-CM | POA: Diagnosis not present

## 2014-09-17 DIAGNOSIS — F79 Unspecified intellectual disabilities: Secondary | ICD-10-CM | POA: Diagnosis not present

## 2014-09-17 DIAGNOSIS — N08 Glomerular disorders in diseases classified elsewhere: Secondary | ICD-10-CM | POA: Diagnosis not present

## 2014-09-17 DIAGNOSIS — G43009 Migraine without aura, not intractable, without status migrainosus: Secondary | ICD-10-CM | POA: Diagnosis not present

## 2014-09-17 DIAGNOSIS — F418 Other specified anxiety disorders: Secondary | ICD-10-CM | POA: Diagnosis not present

## 2014-09-17 DIAGNOSIS — I10 Essential (primary) hypertension: Secondary | ICD-10-CM | POA: Diagnosis not present

## 2014-09-17 DIAGNOSIS — E785 Hyperlipidemia, unspecified: Secondary | ICD-10-CM | POA: Diagnosis not present

## 2014-09-22 DIAGNOSIS — E119 Type 2 diabetes mellitus without complications: Secondary | ICD-10-CM | POA: Diagnosis not present

## 2014-09-26 ENCOUNTER — Ambulatory Visit (INDEPENDENT_AMBULATORY_CARE_PROVIDER_SITE_OTHER): Payer: Medicare Other | Admitting: Podiatry

## 2014-09-26 DIAGNOSIS — Q828 Other specified congenital malformations of skin: Secondary | ICD-10-CM

## 2014-09-26 DIAGNOSIS — E1140 Type 2 diabetes mellitus with diabetic neuropathy, unspecified: Secondary | ICD-10-CM

## 2014-09-26 DIAGNOSIS — M79673 Pain in unspecified foot: Secondary | ICD-10-CM

## 2014-09-26 DIAGNOSIS — M79609 Pain in unspecified limb: Principal | ICD-10-CM

## 2014-09-26 DIAGNOSIS — B351 Tinea unguium: Secondary | ICD-10-CM

## 2014-09-26 NOTE — Progress Notes (Signed)
Subjective: 39 y.o. returns the office today for painful, elongated, thickened toenails which she cannot trim herself. Denies any redness or drainage around the nails. Also states that she gets a painful callus to both of her feet. She may requesting diabetic shoes at this time. They said they get a pair a year. Denies any acute changes since last appointment and no new complaints today. Denies any systemic complaints such as fevers, chills, nausea, vomiting.   Objective: AAO 3, NAD; presents with mother DP/PT pulses palpable, CRT less than 3 seconds Protective sensation appears intact with Simms Weinstein monofilament, Achilles tendon reflex intact.  Nails hypertrophic, dystrophic, elongated, brittle, discolored 10. There is tenderness overlying the nails 1-5 bilaterally. There is no surrounding erythema or drainage along the nail sites. Hyperkeratotic lesion submetatarsal 5 bilaterally. Upon debridement no underlying ulceration, drainage or other clinical signs of infection. No open lesions or pre-ulcerative lesions are identified. No other areas of tenderness bilateral lower extremities. No overlying edema, erythema, increased warmth. No pain with calf compression, swelling, warmth, erythema.  Assessment: Patient presents with symptomatic onychomycosis  Plan: -Treatment options including alternatives, risks, complications were discussed -Nails sharply debrided 10 without complication/bleeding. -Paperwork for diabetic shoes was completed for pre-certification.  -Discussed daily foot inspection. If there are any changes, to call the office immediately.  -Follow-up in 3 months or sooner if any problems are to arise. In the meantime, encouraged to call the office with any questions, concerns, changes symptoms.   Celesta Gentile, DPM

## 2014-11-13 DIAGNOSIS — Z3042 Encounter for surveillance of injectable contraceptive: Secondary | ICD-10-CM | POA: Diagnosis not present

## 2014-11-13 DIAGNOSIS — J301 Allergic rhinitis due to pollen: Secondary | ICD-10-CM | POA: Diagnosis not present

## 2014-11-26 DIAGNOSIS — E1129 Type 2 diabetes mellitus with other diabetic kidney complication: Secondary | ICD-10-CM | POA: Diagnosis not present

## 2014-11-26 DIAGNOSIS — Z23 Encounter for immunization: Secondary | ICD-10-CM | POA: Diagnosis not present

## 2014-11-26 DIAGNOSIS — N08 Glomerular disorders in diseases classified elsewhere: Secondary | ICD-10-CM | POA: Diagnosis not present

## 2014-11-26 DIAGNOSIS — Z6834 Body mass index (BMI) 34.0-34.9, adult: Secondary | ICD-10-CM | POA: Diagnosis not present

## 2014-12-26 ENCOUNTER — Ambulatory Visit (INDEPENDENT_AMBULATORY_CARE_PROVIDER_SITE_OTHER): Payer: Medicare Other

## 2014-12-26 ENCOUNTER — Encounter: Payer: Self-pay | Admitting: Podiatry

## 2014-12-26 ENCOUNTER — Ambulatory Visit (INDEPENDENT_AMBULATORY_CARE_PROVIDER_SITE_OTHER): Payer: Medicare Other | Admitting: Podiatry

## 2014-12-26 DIAGNOSIS — B351 Tinea unguium: Secondary | ICD-10-CM | POA: Diagnosis not present

## 2014-12-26 DIAGNOSIS — M79676 Pain in unspecified toe(s): Secondary | ICD-10-CM | POA: Diagnosis not present

## 2014-12-26 DIAGNOSIS — M79671 Pain in right foot: Secondary | ICD-10-CM

## 2014-12-26 DIAGNOSIS — M79672 Pain in left foot: Secondary | ICD-10-CM

## 2014-12-26 NOTE — Patient Instructions (Signed)

## 2015-01-01 NOTE — Progress Notes (Signed)
Patient ID: Sara Chung, female   DOB: 06-Dec-1975, 39 y.o.   MRN: [id number]  Subjective: 39 y.o. returns the office today for painful, elongated, thickened toenails which she is unable to trim herself. Denies any redness or drainage around the nails.  Her mother who compensated also states that she does complain of pain to both of her feet at times. His been ongoing for greater than 1 year. She denies any recent injury or trauma. She denies any swelling or redness. No tingling or numbness. Feet are not bothering her today as she is been on vacation last week and not been on the much.Denies any acute changes since last appointment and no new complaints today. Denies any systemic complaints such as fevers, chills, nausea, vomiting.   Objective: AAO 3, NAD DP/PT pulses palpable 2/4, CRT less than 3 seconds Nails hypertrophic, dystrophic, elongated, brittle, discolored 10. There is tenderness overlying the nails 1-5 bilaterally. There is no surrounding erythema or drainage along the nail sites. No open lesions or pre-ulcerative lesions are identified.  at this time there is no specific area of tenderness are pinpoint bony tenderness or pain the vibratory sensation of bilateral lower extremity is. Upon palpation of the medial band of plantar fascia subjectively she states this which she has pain Which is not bothering her today. There is no overlying edema, erythema, increase in warmth. No other areas of tenderness bilateral lower extremities. No overlying edema, erythema, increased warmth. No pain with calf compression, swelling, warmth, erythema.  Assessment: Patient presents with symptomatic onychomycosis  Plan: -Treatment options including alternatives, risks, complications were discussed -Nails sharply debrided 10 without complication/bleeding. -Bilateral foot pain, possible plantar fasciitis. Today she is not having symptoms is difficult to isolate where her pain is at. Stretching  exercises were discussed. Ice to the area needed. If the area flares up again to call the office. -Discussed daily foot inspection. If there are any changes, to call the office immediately.  -Follow-up in 3 months or sooner if any problems are to arise. In the meantime, encouraged to call the office with any questions, concerns, changes symptoms.  Celesta Gentile, DPM

## 2015-01-19 ENCOUNTER — Other Ambulatory Visit: Payer: Medicare Other

## 2015-01-20 DIAGNOSIS — R51 Headache: Secondary | ICD-10-CM | POA: Diagnosis not present

## 2015-01-20 DIAGNOSIS — N183 Chronic kidney disease, stage 3 (moderate): Secondary | ICD-10-CM | POA: Diagnosis not present

## 2015-01-20 DIAGNOSIS — N08 Glomerular disorders in diseases classified elsewhere: Secondary | ICD-10-CM | POA: Diagnosis not present

## 2015-01-20 DIAGNOSIS — R251 Tremor, unspecified: Secondary | ICD-10-CM | POA: Diagnosis not present

## 2015-01-20 DIAGNOSIS — E1129 Type 2 diabetes mellitus with other diabetic kidney complication: Secondary | ICD-10-CM | POA: Diagnosis not present

## 2015-01-20 DIAGNOSIS — I10 Essential (primary) hypertension: Secondary | ICD-10-CM | POA: Diagnosis not present

## 2015-01-20 DIAGNOSIS — E784 Other hyperlipidemia: Secondary | ICD-10-CM | POA: Diagnosis not present

## 2015-01-20 DIAGNOSIS — G43009 Migraine without aura, not intractable, without status migrainosus: Secondary | ICD-10-CM | POA: Diagnosis not present

## 2015-01-20 DIAGNOSIS — E1165 Type 2 diabetes mellitus with hyperglycemia: Secondary | ICD-10-CM | POA: Diagnosis not present

## 2015-01-20 DIAGNOSIS — F79 Unspecified intellectual disabilities: Secondary | ICD-10-CM | POA: Diagnosis not present

## 2015-01-20 DIAGNOSIS — Z6834 Body mass index (BMI) 34.0-34.9, adult: Secondary | ICD-10-CM | POA: Diagnosis not present

## 2015-01-20 DIAGNOSIS — F418 Other specified anxiety disorders: Secondary | ICD-10-CM | POA: Diagnosis not present

## 2015-01-23 ENCOUNTER — Ambulatory Visit: Payer: Medicare Other | Admitting: Podiatry

## 2015-02-02 DIAGNOSIS — M5432 Sciatica, left side: Secondary | ICD-10-CM | POA: Diagnosis not present

## 2015-02-12 DIAGNOSIS — M25552 Pain in left hip: Secondary | ICD-10-CM | POA: Diagnosis not present

## 2015-02-12 DIAGNOSIS — R2689 Other abnormalities of gait and mobility: Secondary | ICD-10-CM | POA: Diagnosis not present

## 2015-02-12 DIAGNOSIS — M2560 Stiffness of unspecified joint, not elsewhere classified: Secondary | ICD-10-CM | POA: Diagnosis not present

## 2015-02-12 DIAGNOSIS — M6281 Muscle weakness (generalized): Secondary | ICD-10-CM | POA: Diagnosis not present

## 2015-02-12 DIAGNOSIS — M25652 Stiffness of left hip, not elsewhere classified: Secondary | ICD-10-CM | POA: Diagnosis not present

## 2015-02-12 DIAGNOSIS — M5432 Sciatica, left side: Secondary | ICD-10-CM | POA: Diagnosis not present

## 2015-02-12 DIAGNOSIS — R293 Abnormal posture: Secondary | ICD-10-CM | POA: Diagnosis not present

## 2015-03-10 DIAGNOSIS — R293 Abnormal posture: Secondary | ICD-10-CM | POA: Diagnosis not present

## 2015-03-10 DIAGNOSIS — M2560 Stiffness of unspecified joint, not elsewhere classified: Secondary | ICD-10-CM | POA: Diagnosis not present

## 2015-03-10 DIAGNOSIS — M25552 Pain in left hip: Secondary | ICD-10-CM | POA: Diagnosis not present

## 2015-03-10 DIAGNOSIS — M6281 Muscle weakness (generalized): Secondary | ICD-10-CM | POA: Diagnosis not present

## 2015-03-10 DIAGNOSIS — R2689 Other abnormalities of gait and mobility: Secondary | ICD-10-CM | POA: Diagnosis not present

## 2015-03-10 DIAGNOSIS — M5432 Sciatica, left side: Secondary | ICD-10-CM | POA: Diagnosis not present

## 2015-03-10 DIAGNOSIS — M25652 Stiffness of left hip, not elsewhere classified: Secondary | ICD-10-CM | POA: Diagnosis not present

## 2015-03-12 DIAGNOSIS — M6281 Muscle weakness (generalized): Secondary | ICD-10-CM | POA: Diagnosis not present

## 2015-03-12 DIAGNOSIS — M25652 Stiffness of left hip, not elsewhere classified: Secondary | ICD-10-CM | POA: Diagnosis not present

## 2015-03-12 DIAGNOSIS — M25552 Pain in left hip: Secondary | ICD-10-CM | POA: Diagnosis not present

## 2015-03-12 DIAGNOSIS — M5432 Sciatica, left side: Secondary | ICD-10-CM | POA: Diagnosis not present

## 2015-03-12 DIAGNOSIS — R2689 Other abnormalities of gait and mobility: Secondary | ICD-10-CM | POA: Diagnosis not present

## 2015-03-12 DIAGNOSIS — R293 Abnormal posture: Secondary | ICD-10-CM | POA: Diagnosis not present

## 2015-03-19 DIAGNOSIS — M25652 Stiffness of left hip, not elsewhere classified: Secondary | ICD-10-CM | POA: Diagnosis not present

## 2015-03-19 DIAGNOSIS — R293 Abnormal posture: Secondary | ICD-10-CM | POA: Diagnosis not present

## 2015-03-19 DIAGNOSIS — R2689 Other abnormalities of gait and mobility: Secondary | ICD-10-CM | POA: Diagnosis not present

## 2015-03-19 DIAGNOSIS — M6281 Muscle weakness (generalized): Secondary | ICD-10-CM | POA: Diagnosis not present

## 2015-03-19 DIAGNOSIS — M5432 Sciatica, left side: Secondary | ICD-10-CM | POA: Diagnosis not present

## 2015-03-19 DIAGNOSIS — M25552 Pain in left hip: Secondary | ICD-10-CM | POA: Diagnosis not present

## 2015-03-24 DIAGNOSIS — R293 Abnormal posture: Secondary | ICD-10-CM | POA: Diagnosis not present

## 2015-03-24 DIAGNOSIS — N183 Chronic kidney disease, stage 3 (moderate): Secondary | ICD-10-CM | POA: Diagnosis not present

## 2015-03-24 DIAGNOSIS — E1129 Type 2 diabetes mellitus with other diabetic kidney complication: Secondary | ICD-10-CM | POA: Diagnosis not present

## 2015-03-24 DIAGNOSIS — M6281 Muscle weakness (generalized): Secondary | ICD-10-CM | POA: Diagnosis not present

## 2015-03-24 DIAGNOSIS — R2689 Other abnormalities of gait and mobility: Secondary | ICD-10-CM | POA: Diagnosis not present

## 2015-03-24 DIAGNOSIS — M5432 Sciatica, left side: Secondary | ICD-10-CM | POA: Diagnosis not present

## 2015-03-24 DIAGNOSIS — M25552 Pain in left hip: Secondary | ICD-10-CM | POA: Diagnosis not present

## 2015-03-24 DIAGNOSIS — M25652 Stiffness of left hip, not elsewhere classified: Secondary | ICD-10-CM | POA: Diagnosis not present

## 2015-03-24 DIAGNOSIS — Z6833 Body mass index (BMI) 33.0-33.9, adult: Secondary | ICD-10-CM | POA: Diagnosis not present

## 2015-03-24 DIAGNOSIS — I10 Essential (primary) hypertension: Secondary | ICD-10-CM | POA: Diagnosis not present

## 2015-03-31 DIAGNOSIS — M5432 Sciatica, left side: Secondary | ICD-10-CM | POA: Diagnosis not present

## 2015-03-31 DIAGNOSIS — R293 Abnormal posture: Secondary | ICD-10-CM | POA: Diagnosis not present

## 2015-03-31 DIAGNOSIS — M25652 Stiffness of left hip, not elsewhere classified: Secondary | ICD-10-CM | POA: Diagnosis not present

## 2015-03-31 DIAGNOSIS — M25552 Pain in left hip: Secondary | ICD-10-CM | POA: Diagnosis not present

## 2015-03-31 DIAGNOSIS — R2689 Other abnormalities of gait and mobility: Secondary | ICD-10-CM | POA: Diagnosis not present

## 2015-03-31 DIAGNOSIS — M6281 Muscle weakness (generalized): Secondary | ICD-10-CM | POA: Diagnosis not present

## 2015-04-02 DIAGNOSIS — M6281 Muscle weakness (generalized): Secondary | ICD-10-CM | POA: Diagnosis not present

## 2015-04-02 DIAGNOSIS — M5432 Sciatica, left side: Secondary | ICD-10-CM | POA: Diagnosis not present

## 2015-04-02 DIAGNOSIS — M25652 Stiffness of left hip, not elsewhere classified: Secondary | ICD-10-CM | POA: Diagnosis not present

## 2015-04-02 DIAGNOSIS — M25552 Pain in left hip: Secondary | ICD-10-CM | POA: Diagnosis not present

## 2015-04-02 DIAGNOSIS — R293 Abnormal posture: Secondary | ICD-10-CM | POA: Diagnosis not present

## 2015-04-02 DIAGNOSIS — R2689 Other abnormalities of gait and mobility: Secondary | ICD-10-CM | POA: Diagnosis not present

## 2015-04-13 DIAGNOSIS — J0100 Acute maxillary sinusitis, unspecified: Secondary | ICD-10-CM | POA: Diagnosis not present

## 2015-04-14 ENCOUNTER — Ambulatory Visit (INDEPENDENT_AMBULATORY_CARE_PROVIDER_SITE_OTHER): Payer: Medicare Other | Admitting: Podiatry

## 2015-04-14 ENCOUNTER — Encounter: Payer: Self-pay | Admitting: Podiatry

## 2015-04-14 DIAGNOSIS — Q828 Other specified congenital malformations of skin: Secondary | ICD-10-CM | POA: Diagnosis not present

## 2015-04-14 DIAGNOSIS — B351 Tinea unguium: Secondary | ICD-10-CM

## 2015-04-14 DIAGNOSIS — E1140 Type 2 diabetes mellitus with diabetic neuropathy, unspecified: Secondary | ICD-10-CM | POA: Diagnosis not present

## 2015-04-14 DIAGNOSIS — M79676 Pain in unspecified toe(s): Secondary | ICD-10-CM | POA: Diagnosis not present

## 2015-04-16 NOTE — Progress Notes (Signed)
Patient ID: Sara Chung, female   DOB: [birth year], 40 y.o.   MRN: [id number]  Subjective: 40 y.o. returns the office today for painful, elongated, thickened toenails which she is unable to trim herself. Denies any redness or drainage around the nails.  She has received new diabetic shoes and inserts and they were rubbing some causing a small callus formed on the fifth toe on the left side in the ball the foot on the right side. No drainage or swelling redness.  No pain to her feet at this time. Denies any acute changes since last appointment and no new complaints today. Denies any systemic complaints such as fevers, chills, nausea, vomiting.   Objective: AAO 3, NAD DP/PT pulses palpable 2/4, CRT less than 3 seconds Decreased sensation with Simms Weinstein monofilament. Nails hypertrophic, dystrophic, elongated, brittle, discolored 10. There is tenderness overlying the nails 1-5 bilaterally. There is no surrounding erythema or drainage along the nail sites. Small hyperkeratotic lesions right submetatarsal left fifth toe. Upon debridement no underlying ulceration, drainage or other signs of infection. No open lesions or pre-ulcerative lesions are identified. No other areas of tenderness bilateral lower extremities. No overlying edema, erythema, increased warmth. No pain with calf compression, swelling, warmth, erythema.  Assessment: Patient presents with symptomatic onychomycosis; pre-ulcerative calluses  Plan: -Treatment options including alternatives, risks, complications were discussed -Nails sharply debrided 10 without complication/bleeding. -Calluses debrided 2 without complications or bleeding. Recommended her to go to [facility] Issues or modifications. -Discussed daily foot inspection. If there are any changes, to call the office immediately.  -Follow-up in 3 months or sooner if any problems are to arise. In the meantime, encouraged to call the office with any questions, concerns,  changes symptoms.  Celesta Gentile, DPM

## 2015-05-07 DIAGNOSIS — J209 Acute bronchitis, unspecified: Secondary | ICD-10-CM | POA: Diagnosis not present

## 2015-05-07 DIAGNOSIS — Z0000 Encounter for general adult medical examination without abnormal findings: Secondary | ICD-10-CM | POA: Diagnosis not present

## 2015-05-28 DIAGNOSIS — G43009 Migraine without aura, not intractable, without status migrainosus: Secondary | ICD-10-CM | POA: Diagnosis not present

## 2015-05-28 DIAGNOSIS — N183 Chronic kidney disease, stage 3 (moderate): Secondary | ICD-10-CM | POA: Diagnosis not present

## 2015-05-28 DIAGNOSIS — F79 Unspecified intellectual disabilities: Secondary | ICD-10-CM | POA: Diagnosis not present

## 2015-05-28 DIAGNOSIS — E1129 Type 2 diabetes mellitus with other diabetic kidney complication: Secondary | ICD-10-CM | POA: Diagnosis not present

## 2015-05-28 DIAGNOSIS — F418 Other specified anxiety disorders: Secondary | ICD-10-CM | POA: Diagnosis not present

## 2015-05-28 DIAGNOSIS — R109 Unspecified abdominal pain: Secondary | ICD-10-CM | POA: Diagnosis not present

## 2015-05-28 DIAGNOSIS — I10 Essential (primary) hypertension: Secondary | ICD-10-CM | POA: Diagnosis not present

## 2015-05-28 DIAGNOSIS — Z6832 Body mass index (BMI) 32.0-32.9, adult: Secondary | ICD-10-CM | POA: Diagnosis not present

## 2015-05-28 DIAGNOSIS — E784 Other hyperlipidemia: Secondary | ICD-10-CM | POA: Diagnosis not present

## 2015-05-28 DIAGNOSIS — R10817 Generalized abdominal tenderness: Secondary | ICD-10-CM | POA: Diagnosis not present

## 2015-06-09 DIAGNOSIS — N1330 Unspecified hydronephrosis: Secondary | ICD-10-CM | POA: Diagnosis not present

## 2015-06-09 DIAGNOSIS — K838 Other specified diseases of biliary tract: Secondary | ICD-10-CM | POA: Diagnosis not present

## 2015-07-20 ENCOUNTER — Ambulatory Visit: Payer: Medicare Other | Admitting: Podiatry

## 2015-07-20 ENCOUNTER — Ambulatory Visit (INDEPENDENT_AMBULATORY_CARE_PROVIDER_SITE_OTHER): Payer: Medicare Other | Admitting: Podiatry

## 2015-07-20 ENCOUNTER — Encounter: Payer: Self-pay | Admitting: Podiatry

## 2015-07-20 DIAGNOSIS — M79676 Pain in unspecified toe(s): Secondary | ICD-10-CM | POA: Diagnosis not present

## 2015-07-20 DIAGNOSIS — B351 Tinea unguium: Secondary | ICD-10-CM

## 2015-07-20 DIAGNOSIS — M779 Enthesopathy, unspecified: Secondary | ICD-10-CM

## 2015-07-22 DIAGNOSIS — S63502A Unspecified sprain of left wrist, initial encounter: Secondary | ICD-10-CM | POA: Diagnosis not present

## 2015-07-22 DIAGNOSIS — M25532 Pain in left wrist: Secondary | ICD-10-CM | POA: Diagnosis not present

## 2015-07-23 DIAGNOSIS — Z6832 Body mass index (BMI) 32.0-32.9, adult: Secondary | ICD-10-CM | POA: Diagnosis not present

## 2015-07-23 DIAGNOSIS — N183 Chronic kidney disease, stage 3 (moderate): Secondary | ICD-10-CM | POA: Diagnosis not present

## 2015-07-23 DIAGNOSIS — E1129 Type 2 diabetes mellitus with other diabetic kidney complication: Secondary | ICD-10-CM | POA: Diagnosis not present

## 2015-07-23 NOTE — Progress Notes (Signed)
Patient ID: Sara Chung, female   DOB: 1975/04/05, 40 y.o.   MRN: [id number]  Subjective: 40 y.o. returns the office today for painful, elongated, thickened toenails which she is unable to trim herself. Denies any redness or drainage around the nails.  She has been getting some pain to the inside aspect of the left ankle which is been ongoing for about 2 months. No recent injury or trauma. The pain is intermittent. Unable to identify when the pain is worse or what makes pain worse. No swelling or redness.. Denies any acute changes since last appointment and no new complaints today. Denies any systemic complaints such as fevers, chills, nausea, vomiting.   Objective: AAO 3, NAD DP/PT pulses palpable 2/4, CRT less than 3 seconds Decreased sensation with Simms Weinstein monofilament. Nails hypertrophic, dystrophic, elongated, brittle, discolored 10. There is tenderness overlying the nails 1-5 bilaterally. There is no surrounding erythema or drainage along the nail sites. There appears to be some mild discomfort on the posterior tibial tendon along the medial aspect of the ankle just posterior to the medial malleolus. There is no area pinpoint bony tenderness. No pain vibratory sensation. Ankle, subtalar joint range of motion intact. No overlying edema, erythema, increase in warmth. No open lesions or pre-ulcerative lesions are identified. No other areas of tenderness bilateral lower extremities. No overlying edema, erythema, increased warmth. No pain with calf compression, swelling, warmth, erythema.  Assessment: Patient presents with symptomatic onychomycosis; likely tendinitis  Plan: -Treatment options including alternatives, risks, complications were discussed -Nails sharply debrided 10 without complication/bleeding. -Discussed tendinitis as well as treatments. Ice to the area. Dispensed ankle brace to wear as needed. -Discussed daily foot inspection. If there are any changes, to call  the office immediately.  -Follow-up in 3 months or sooner if symptoms are worsening if any problems are to arise. In the meantime, encouraged to call the office with any questions, concerns, changes symptoms.  Celesta Gentile, DPM

## 2015-08-05 DIAGNOSIS — N912 Amenorrhea, unspecified: Secondary | ICD-10-CM | POA: Diagnosis not present

## 2015-08-05 DIAGNOSIS — Z3040 Encounter for surveillance of contraceptives, unspecified: Secondary | ICD-10-CM | POA: Diagnosis not present

## 2015-08-10 DIAGNOSIS — S62025A Nondisplaced fracture of middle third of navicular [scaphoid] bone of left wrist, initial encounter for closed fracture: Secondary | ICD-10-CM | POA: Diagnosis not present

## 2015-08-10 DIAGNOSIS — S52125A Nondisplaced fracture of head of left radius, initial encounter for closed fracture: Secondary | ICD-10-CM | POA: Diagnosis not present

## 2015-08-11 DIAGNOSIS — S4992XA Unspecified injury of left shoulder and upper arm, initial encounter: Secondary | ICD-10-CM | POA: Diagnosis not present

## 2015-08-11 DIAGNOSIS — M25532 Pain in left wrist: Secondary | ICD-10-CM | POA: Diagnosis not present

## 2015-08-17 DIAGNOSIS — M79645 Pain in left finger(s): Secondary | ICD-10-CM | POA: Diagnosis not present

## 2015-09-30 DIAGNOSIS — I10 Essential (primary) hypertension: Secondary | ICD-10-CM | POA: Diagnosis not present

## 2015-09-30 DIAGNOSIS — G43009 Migraine without aura, not intractable, without status migrainosus: Secondary | ICD-10-CM | POA: Diagnosis not present

## 2015-09-30 DIAGNOSIS — Z6831 Body mass index (BMI) 31.0-31.9, adult: Secondary | ICD-10-CM | POA: Diagnosis not present

## 2015-09-30 DIAGNOSIS — E784 Other hyperlipidemia: Secondary | ICD-10-CM | POA: Diagnosis not present

## 2015-09-30 DIAGNOSIS — E1129 Type 2 diabetes mellitus with other diabetic kidney complication: Secondary | ICD-10-CM | POA: Diagnosis not present

## 2015-09-30 DIAGNOSIS — F418 Other specified anxiety disorders: Secondary | ICD-10-CM | POA: Diagnosis not present

## 2015-09-30 DIAGNOSIS — F79 Unspecified intellectual disabilities: Secondary | ICD-10-CM | POA: Diagnosis not present

## 2015-09-30 DIAGNOSIS — N183 Chronic kidney disease, stage 3 (moderate): Secondary | ICD-10-CM | POA: Diagnosis not present

## 2015-10-13 DIAGNOSIS — J029 Acute pharyngitis, unspecified: Secondary | ICD-10-CM | POA: Diagnosis not present

## 2015-10-13 DIAGNOSIS — M25572 Pain in left ankle and joints of left foot: Secondary | ICD-10-CM | POA: Diagnosis not present

## 2015-10-13 DIAGNOSIS — Z3040 Encounter for surveillance of contraceptives, unspecified: Secondary | ICD-10-CM | POA: Diagnosis not present

## 2015-10-19 ENCOUNTER — Ambulatory Visit: Payer: Medicare Other | Admitting: Podiatry

## 2015-10-21 ENCOUNTER — Ambulatory Visit: Payer: Medicare Other | Admitting: Podiatry

## 2015-11-02 ENCOUNTER — Ambulatory Visit (INDEPENDENT_AMBULATORY_CARE_PROVIDER_SITE_OTHER): Payer: Medicare Other | Admitting: Podiatry

## 2015-11-02 ENCOUNTER — Encounter: Payer: Self-pay | Admitting: Podiatry

## 2015-11-02 DIAGNOSIS — M2040 Other hammer toe(s) (acquired), unspecified foot: Secondary | ICD-10-CM

## 2015-11-02 DIAGNOSIS — M79676 Pain in unspecified toe(s): Secondary | ICD-10-CM | POA: Diagnosis not present

## 2015-11-02 DIAGNOSIS — E1140 Type 2 diabetes mellitus with diabetic neuropathy, unspecified: Secondary | ICD-10-CM | POA: Diagnosis not present

## 2015-11-02 DIAGNOSIS — Q828 Other specified congenital malformations of skin: Secondary | ICD-10-CM

## 2015-11-02 DIAGNOSIS — B351 Tinea unguium: Secondary | ICD-10-CM

## 2015-11-03 NOTE — Progress Notes (Signed)
Patient ID: Sara Chung, female   DOB: [birth year], 40 y.o.   MRN: [id number]  Subjective: 40 y.o. returns the office today for painful, elongated, thickened toenails which she is unable to trim herself. Denies any redness or drainage around the nails. She also has calluses the balls the right foot. She is requesting diabetic shoes today. Denies any acute changes since last appointment and no new complaints today. Denies any systemic complaints such as fevers, chills, nausea, vomiting.   Objective: AAO 3, NAD DP/PT pulses palpable 2/4, CRT less than 3 seconds Decreased sensation with Simms Weinstein monofilament. Nails hypertrophic, dystrophic, elongated, brittle, discolored 10. There is tenderness overlying the nails 1-5 bilaterally. There is no surrounding erythema or drainage along the nail sites. Hyperkeratotic lesions present right foot submetatarsal 3/5. Upon debridement no underlying ulceration, drainage or other signs of infection. No open lesions or other pre-ulcerative lesions are identified. No other areas of tenderness bilateral lower extremities. No overlying edema, erythema, increased warmth.  Hammertoes present.  No pain with calf compression, swelling, warmth, erythema.  Assessment: Patient presents with symptomatic onychomycosis; hyperkeratotic lesions  Plan: -Treatment options including alternatives, risks, complications were discussed -Nails sharply debrided 10 without complication/bleeding. -Hyperkeratotic lesions debrided 3 without, occasions or bleeding. -Paperwork was completed today for precertification diabetic shoes. -Discussed daily foot inspection. If there are any changes, to call the office immediately.  -Follow-up in 3 months or sooner if symptoms are worsening if any problems are to arise. In the meantime, encouraged to call the office with any questions, concerns, changes symptoms.  Celesta Gentile, DPM

## 2015-11-18 ENCOUNTER — Ambulatory Visit (INDEPENDENT_AMBULATORY_CARE_PROVIDER_SITE_OTHER): Payer: Medicare Other | Admitting: Podiatry

## 2015-11-18 DIAGNOSIS — E1140 Type 2 diabetes mellitus with diabetic neuropathy, unspecified: Secondary | ICD-10-CM | POA: Diagnosis not present

## 2015-11-18 DIAGNOSIS — Z23 Encounter for immunization: Secondary | ICD-10-CM | POA: Diagnosis not present

## 2015-11-18 DIAGNOSIS — E1129 Type 2 diabetes mellitus with other diabetic kidney complication: Secondary | ICD-10-CM | POA: Diagnosis not present

## 2015-11-18 DIAGNOSIS — N183 Chronic kidney disease, stage 3 (moderate): Secondary | ICD-10-CM | POA: Diagnosis not present

## 2015-11-18 DIAGNOSIS — M216X9 Other acquired deformities of unspecified foot: Secondary | ICD-10-CM

## 2015-11-18 DIAGNOSIS — Z6831 Body mass index (BMI) 31.0-31.9, adult: Secondary | ICD-10-CM | POA: Diagnosis not present

## 2015-11-18 DIAGNOSIS — I10 Essential (primary) hypertension: Secondary | ICD-10-CM | POA: Diagnosis not present

## 2015-12-08 NOTE — Progress Notes (Signed)
Diabetic Shoes Dispensed with instructions

## 2016-01-07 DIAGNOSIS — R5383 Other fatigue: Secondary | ICD-10-CM | POA: Diagnosis not present

## 2016-01-07 DIAGNOSIS — R21 Rash and other nonspecific skin eruption: Secondary | ICD-10-CM | POA: Diagnosis not present

## 2016-01-07 DIAGNOSIS — D519 Vitamin B12 deficiency anemia, unspecified: Secondary | ICD-10-CM | POA: Diagnosis not present

## 2016-01-07 DIAGNOSIS — Z3040 Encounter for surveillance of contraceptives, unspecified: Secondary | ICD-10-CM | POA: Diagnosis not present

## 2016-01-14 DIAGNOSIS — R5383 Other fatigue: Secondary | ICD-10-CM | POA: Diagnosis not present

## 2016-02-08 ENCOUNTER — Ambulatory Visit (INDEPENDENT_AMBULATORY_CARE_PROVIDER_SITE_OTHER): Payer: Medicare Other | Admitting: Podiatry

## 2016-02-08 ENCOUNTER — Encounter: Payer: Self-pay | Admitting: Podiatry

## 2016-02-08 DIAGNOSIS — M216X9 Other acquired deformities of unspecified foot: Secondary | ICD-10-CM | POA: Diagnosis not present

## 2016-02-08 DIAGNOSIS — L84 Corns and callosities: Secondary | ICD-10-CM

## 2016-02-08 DIAGNOSIS — M79676 Pain in unspecified toe(s): Secondary | ICD-10-CM | POA: Diagnosis not present

## 2016-02-08 DIAGNOSIS — M216X2 Other acquired deformities of left foot: Secondary | ICD-10-CM | POA: Diagnosis not present

## 2016-02-08 DIAGNOSIS — M216X1 Other acquired deformities of right foot: Secondary | ICD-10-CM | POA: Diagnosis not present

## 2016-02-08 DIAGNOSIS — E1140 Type 2 diabetes mellitus with diabetic neuropathy, unspecified: Secondary | ICD-10-CM

## 2016-02-08 DIAGNOSIS — Q828 Other specified congenital malformations of skin: Secondary | ICD-10-CM

## 2016-02-08 DIAGNOSIS — B351 Tinea unguium: Secondary | ICD-10-CM | POA: Diagnosis not present

## 2016-02-09 NOTE — Progress Notes (Signed)
Patient ID: Sara Chung, female   DOB: 1975-05-31, 40 y.o.   MRN: [id number]  Subjective: 40 y.o. returns the office today for painful, elongated, thickened toenails which she is unable to trim herself. Denies any redness or drainage around the nails. She also has calluses the balls the right foot. She presents today also to pick up diabetic shoes. Denies any acute changes since last appointment and no new complaints today. Denies any systemic complaints such as fevers, chills, nausea, vomiting.   Objective: AAO 3, NAD DP/PT pulses palpable 2/4, CRT less than 3 seconds Decreased sensation with Simms Weinstein monofilament. Nails hypertrophic, dystrophic, elongated, brittle, discolored 10. There is tenderness overlying the nails 1-5 bilaterally. There is no surrounding erythema or drainage along the nail sites. Hyperkeratotic lesions present right foot submetatarsal 4. Upon debridement no underlying ulceration, drainage or other signs of infection. No open lesions or other pre-ulcerative lesions are identified. No other areas of tenderness bilateral lower extremities. No overlying edema, erythema, increased warmth.  Hammertoes present.  No pain with calf compression, swelling, warmth, erythema.  Assessment: Patient presents with symptomatic onychomycosis; hyperkeratotic lesions  Plan: -Treatment options including alternatives, risks, complications were discussed -Nails sharply debrided 10 without complication/bleeding. -Hyperkeratotic lesions debrided 1 without complications or bleeding. -Diabetic shoes dispensed today. They were fitting well. Oral and written break-in instructions discussed. Monitor for any skin break down.  -Discussed daily foot inspection. If there are any changes, to call the office immediately.  -Follow-up in 3 months or sooner if symptoms are worsening if any problems are to arise. In the meantime, encouraged to call the office with any questions, concerns,  changes symptoms.  Celesta Gentile, DPM

## 2016-02-18 DIAGNOSIS — R5383 Other fatigue: Secondary | ICD-10-CM | POA: Diagnosis not present

## 2016-04-05 DIAGNOSIS — R358 Other polyuria: Secondary | ICD-10-CM | POA: Diagnosis not present

## 2016-04-05 DIAGNOSIS — D510 Vitamin B12 deficiency anemia due to intrinsic factor deficiency: Secondary | ICD-10-CM | POA: Diagnosis not present

## 2016-04-05 DIAGNOSIS — Z3040 Encounter for surveillance of contraceptives, unspecified: Secondary | ICD-10-CM | POA: Diagnosis not present

## 2016-04-05 DIAGNOSIS — J00 Acute nasopharyngitis [common cold]: Secondary | ICD-10-CM | POA: Diagnosis not present

## 2016-05-05 ENCOUNTER — Other Ambulatory Visit: Payer: Self-pay | Admitting: Endocrinology

## 2016-05-05 DIAGNOSIS — Z1231 Encounter for screening mammogram for malignant neoplasm of breast: Secondary | ICD-10-CM

## 2016-05-09 ENCOUNTER — Ambulatory Visit (INDEPENDENT_AMBULATORY_CARE_PROVIDER_SITE_OTHER): Payer: Medicare Other | Admitting: Podiatry

## 2016-05-09 ENCOUNTER — Encounter: Payer: Self-pay | Admitting: Podiatry

## 2016-05-09 DIAGNOSIS — B351 Tinea unguium: Secondary | ICD-10-CM | POA: Diagnosis not present

## 2016-05-09 DIAGNOSIS — Q828 Other specified congenital malformations of skin: Secondary | ICD-10-CM | POA: Diagnosis not present

## 2016-05-09 DIAGNOSIS — E1140 Type 2 diabetes mellitus with diabetic neuropathy, unspecified: Secondary | ICD-10-CM | POA: Diagnosis not present

## 2016-05-09 DIAGNOSIS — M79676 Pain in unspecified toe(s): Secondary | ICD-10-CM | POA: Diagnosis not present

## 2016-05-15 NOTE — Progress Notes (Signed)
Patient ID: Sara Chung, female   DOB: [birth year], 41 y.o.   MRN: [id number]  Subjective: 41 y.o. returns the office today for painful, elongated, thickened toenails which she is unable to trim herself. Denies any redness or drainage around the nails. She also has calluses the balls the right foot. She states that the diabetic shoe that she cut last Dilantin causes blisters or left heel how this areas was completely healed. Denies any redness or warmth. Denies any acute changes since last appointment and no new complaints today. Denies any systemic complaints such as fevers, chills, nausea, vomiting.   Objective: AAO 3, NAD DP/PT pulses palpable 2/4, CRT less than 3 seconds Decreased sensation with Simms Weinstein monofilament. Nails hypertrophic, dystrophic, elongated, brittle, discolored 10. There is tenderness overlying the nails 1-5 bilaterally. There is no surrounding erythema or drainage along the nail sites. Hyperkeratotic lesions present right foot submetatarsal 4. Upon debridement no underlying ulceration, drainage or other signs of infection. On the posterior aspect of the left heels appears to be a small superficial scab present from what appear to be an old blister. There is no surrounding erythema, drainage or pus. There is no malodor. There is no pain in this area. No open lesions or other pre-ulcerative lesions are identified. No other areas of tenderness bilateral lower extremities. No overlying edema, erythema, increased warmth.  Hammertoes present.  No pain with calf compression, swelling, warmth, erythema.  Assessment: Patient presents with symptomatic onychomycosis; hyperkeratotic lesions  Plan: -Treatment options including alternatives, risks, complications were discussed -Nails sharply debrided 10 without complication/bleeding. -Hyperkeratotic lesions debrided 1 without complications or bleeding. -[name] modified her diabetic shoe to avoid any further pressure to  the heel. The area of concern is almost healed and no signs of infection. If not completely healed in 4 weeks to call the office for follow-up. -Diabetic shoes dispensed today. They were fitting well. Oral and written break-in instructions discussed. Monitor for any skin break down.  -Discussed daily foot inspection. If there are any changes, to call the office immediately.  -Follow-up in 3 months or sooner if symptoms are worsening if any problems are to arise. In the meantime, encouraged to call the office with any questions, concerns, changes symptoms.  Sara Chung, DPM

## 2016-05-19 DIAGNOSIS — E1129 Type 2 diabetes mellitus with other diabetic kidney complication: Secondary | ICD-10-CM | POA: Diagnosis not present

## 2016-05-19 DIAGNOSIS — F79 Unspecified intellectual disabilities: Secondary | ICD-10-CM | POA: Diagnosis not present

## 2016-05-19 DIAGNOSIS — N183 Chronic kidney disease, stage 3 (moderate): Secondary | ICD-10-CM | POA: Diagnosis not present

## 2016-05-19 DIAGNOSIS — E784 Other hyperlipidemia: Secondary | ICD-10-CM | POA: Diagnosis not present

## 2016-05-19 DIAGNOSIS — I10 Essential (primary) hypertension: Secondary | ICD-10-CM | POA: Diagnosis not present

## 2016-05-19 DIAGNOSIS — F418 Other specified anxiety disorders: Secondary | ICD-10-CM | POA: Diagnosis not present

## 2016-05-19 DIAGNOSIS — R251 Tremor, unspecified: Secondary | ICD-10-CM | POA: Diagnosis not present

## 2016-05-19 DIAGNOSIS — G43009 Migraine without aura, not intractable, without status migrainosus: Secondary | ICD-10-CM | POA: Diagnosis not present

## 2016-05-19 DIAGNOSIS — Z6833 Body mass index (BMI) 33.0-33.9, adult: Secondary | ICD-10-CM | POA: Diagnosis not present

## 2016-05-19 DIAGNOSIS — Z1389 Encounter for screening for other disorder: Secondary | ICD-10-CM | POA: Diagnosis not present

## 2016-05-20 ENCOUNTER — Ambulatory Visit
Admission: RE | Admit: 2016-05-20 | Discharge: 2016-05-20 | Disposition: A | Discharge status: Home / Self Care | Payer: Medicare Other | Source: Ambulatory Visit | Attending: Endocrinology | Admitting: Endocrinology

## 2016-05-20 DIAGNOSIS — Z1231 Encounter for screening mammogram for malignant neoplasm of breast: Secondary | ICD-10-CM

## 2016-06-02 DIAGNOSIS — D510 Vitamin B12 deficiency anemia due to intrinsic factor deficiency: Secondary | ICD-10-CM | POA: Diagnosis not present

## 2016-06-17 DIAGNOSIS — L03115 Cellulitis of right lower limb: Secondary | ICD-10-CM | POA: Diagnosis not present

## 2016-07-04 DIAGNOSIS — Z3040 Encounter for surveillance of contraceptives, unspecified: Secondary | ICD-10-CM | POA: Diagnosis not present

## 2016-07-04 DIAGNOSIS — D510 Vitamin B12 deficiency anemia due to intrinsic factor deficiency: Secondary | ICD-10-CM | POA: Diagnosis not present

## 2016-08-10 DIAGNOSIS — D510 Vitamin B12 deficiency anemia due to intrinsic factor deficiency: Secondary | ICD-10-CM | POA: Diagnosis not present

## 2016-08-11 ENCOUNTER — Ambulatory Visit (INDEPENDENT_AMBULATORY_CARE_PROVIDER_SITE_OTHER): Payer: Medicare Other | Admitting: Podiatry

## 2016-08-11 ENCOUNTER — Encounter: Payer: Self-pay | Admitting: Podiatry

## 2016-08-11 DIAGNOSIS — B351 Tinea unguium: Secondary | ICD-10-CM | POA: Diagnosis not present

## 2016-08-11 DIAGNOSIS — E1140 Type 2 diabetes mellitus with diabetic neuropathy, unspecified: Secondary | ICD-10-CM | POA: Diagnosis not present

## 2016-08-11 DIAGNOSIS — M79676 Pain in unspecified toe(s): Secondary | ICD-10-CM | POA: Diagnosis not present

## 2016-08-11 DIAGNOSIS — Q828 Other specified congenital malformations of skin: Secondary | ICD-10-CM | POA: Diagnosis not present

## 2016-08-14 NOTE — Progress Notes (Signed)
Patient ID: Sara Chung, female   DOB: 08/11/1975, 41 y.o.   MRN: [id number]  Subjective: 41 y.o. returns the office today for painful, elongated, thickened toenails which she is unable to trim herself. Denies any redness or drainage around the nails. She also has calluses the balls the right foot. Denies any swelling or redness or any drainage. Denies any acute changes since last appointment and no new complaints today. Denies any systemic complaints such as fevers, chills, nausea, vomiting.   Objective: NAD DP/PT pulses palpable 2/4, CRT less than 3 seconds Decreased sensation with Simms Weinstein monofilament. Nails hypertrophic, dystrophic, elongated, brittle, discolored 10. There is tenderness overlying the nails 1-5 bilaterally. There is no surrounding erythema or drainage along the nail sites. Hyperkeratotic lesions present right foot submetatarsal 5. Upon debridement no underlying ulceration, drainage or other signs of infection. No open lesions or other pre-ulcerative lesions are identified. No other areas of tenderness bilateral lower extremities. No overlying edema, erythema, increased warmth.  Hammertoes present.  No pain with calf compression, swelling, warmth, erythema.  Assessment: Patient presents with symptomatic onychomycosis; hyperkeratotic lesions  Plan: -Treatment options including alternatives, risks, complications were discussed -Nails sharply debrided 10 without complication/bleeding. -Hyperkeratotic lesions debrided 1 without complications or bleeding. -Discussed daily foot inspection. If there are any changes, to call the office immediately.  -Follow-up in 3 months or sooner if symptoms are worsening if any problems are to arise. In the meantime, encouraged to call the office with any questions, concerns, changes symptoms.  Celesta Gentile, DPM

## 2016-08-15 DIAGNOSIS — I10 Essential (primary) hypertension: Secondary | ICD-10-CM | POA: Diagnosis not present

## 2016-08-15 DIAGNOSIS — R51 Headache: Secondary | ICD-10-CM | POA: Diagnosis not present

## 2016-08-15 DIAGNOSIS — Z1389 Encounter for screening for other disorder: Secondary | ICD-10-CM | POA: Diagnosis not present

## 2016-08-15 DIAGNOSIS — N183 Chronic kidney disease, stage 3 (moderate): Secondary | ICD-10-CM | POA: Diagnosis not present

## 2016-08-15 DIAGNOSIS — F79 Unspecified intellectual disabilities: Secondary | ICD-10-CM | POA: Diagnosis not present

## 2016-08-15 DIAGNOSIS — E1129 Type 2 diabetes mellitus with other diabetic kidney complication: Secondary | ICD-10-CM | POA: Diagnosis not present

## 2016-08-15 DIAGNOSIS — Z6833 Body mass index (BMI) 33.0-33.9, adult: Secondary | ICD-10-CM | POA: Diagnosis not present

## 2016-08-15 DIAGNOSIS — E784 Other hyperlipidemia: Secondary | ICD-10-CM | POA: Diagnosis not present

## 2016-09-15 DIAGNOSIS — Z0000 Encounter for general adult medical examination without abnormal findings: Secondary | ICD-10-CM | POA: Diagnosis not present

## 2016-09-15 DIAGNOSIS — Z6835 Body mass index (BMI) 35.0-35.9, adult: Secondary | ICD-10-CM | POA: Diagnosis not present

## 2016-09-15 DIAGNOSIS — M26609 Unspecified temporomandibular joint disorder, unspecified side: Secondary | ICD-10-CM | POA: Diagnosis not present

## 2016-09-15 DIAGNOSIS — Z3040 Encounter for surveillance of contraceptives, unspecified: Secondary | ICD-10-CM | POA: Diagnosis not present

## 2016-09-15 DIAGNOSIS — D510 Vitamin B12 deficiency anemia due to intrinsic factor deficiency: Secondary | ICD-10-CM | POA: Diagnosis not present

## 2016-11-11 ENCOUNTER — Ambulatory Visit: Payer: Medicare Other | Admitting: Podiatry

## 2016-11-11 DIAGNOSIS — Z23 Encounter for immunization: Secondary | ICD-10-CM | POA: Diagnosis not present

## 2016-11-14 DIAGNOSIS — E119 Type 2 diabetes mellitus without complications: Secondary | ICD-10-CM | POA: Diagnosis not present

## 2016-11-24 ENCOUNTER — Ambulatory Visit: Payer: Medicare Other | Admitting: Podiatry

## 2016-12-09 ENCOUNTER — Encounter: Payer: Self-pay | Admitting: Podiatry

## 2016-12-09 ENCOUNTER — Ambulatory Visit (INDEPENDENT_AMBULATORY_CARE_PROVIDER_SITE_OTHER): Payer: Medicare Other | Admitting: Podiatry

## 2016-12-09 DIAGNOSIS — Q828 Other specified congenital malformations of skin: Secondary | ICD-10-CM

## 2016-12-09 DIAGNOSIS — E1140 Type 2 diabetes mellitus with diabetic neuropathy, unspecified: Secondary | ICD-10-CM

## 2016-12-09 DIAGNOSIS — M79676 Pain in unspecified toe(s): Secondary | ICD-10-CM | POA: Diagnosis not present

## 2016-12-09 DIAGNOSIS — B351 Tinea unguium: Secondary | ICD-10-CM | POA: Diagnosis not present

## 2016-12-09 NOTE — Progress Notes (Signed)
Patient ID: Sara Chung, female   DOB: 07/03/1975, 41 y.o.   MRN: [id number]  Subjective: 41 y.o. returns the office today for painful, elongated, thickened toenails which she is unable to trim herself. Denies any redness or drainage around the nails. She also has calluses the balls the right foot. Denies any swelling or redness or any drainage. Denies any acute changes since last appointment and no new complaints today. Denies any systemic complaints such as fevers, chills, nausea, vomiting.   Objective: NAD DP/PT pulses palpable 2/4, CRT less than 3 seconds Decreased sensation with Simms Weinstein monofilament. Nails hypertrophic, dystrophic, elongated, brittle, discolored 10. There is tenderness overlying the nails 1-5 bilaterally. There is no surrounding erythema or drainage along the nail sites. Hyperkeratotic lesions present right foot submetatarsal 5. Upon debridement no underlying ulceration, drainage or other signs of infection. No open lesions or other pre-ulcerative lesions are identified. No other areas of tenderness bilateral lower extremities. No overlying edema, erythema, increased warmth.  Hammertoes present.  No pain with calf compression, swelling, warmth, erythema.  Assessment: Patient presents with symptomatic onychomycosis; hyperkeratotic lesions  Plan: -Treatment options including alternatives, risks, complications were discussed -Nails sharply debrided 10 without complication/bleeding. -Hyperkeratotic lesions debrided 1 without complications or bleeding. -Paperwork was completed today for precertification diabetic shoes. Given her history of diabetes with neuropathy, digit deformity, and calluses she would benefit from new diabetic shoes. -Discussed daily foot inspection. If there are any changes, to call the office immediately.  -Follow-up in 3 months or sooner if symptoms are worsening if any problems are to arise. In the meantime, encouraged to call the  office with any questions, concerns, changes symptoms.  Celesta Gentile, DPM

## 2016-12-21 DIAGNOSIS — I10 Essential (primary) hypertension: Secondary | ICD-10-CM | POA: Diagnosis not present

## 2016-12-21 DIAGNOSIS — L84 Corns and callosities: Secondary | ICD-10-CM | POA: Diagnosis not present

## 2016-12-21 DIAGNOSIS — F418 Other specified anxiety disorders: Secondary | ICD-10-CM | POA: Diagnosis not present

## 2016-12-21 DIAGNOSIS — F79 Unspecified intellectual disabilities: Secondary | ICD-10-CM | POA: Diagnosis not present

## 2016-12-21 DIAGNOSIS — E1129 Type 2 diabetes mellitus with other diabetic kidney complication: Secondary | ICD-10-CM | POA: Diagnosis not present

## 2016-12-21 DIAGNOSIS — G43009 Migraine without aura, not intractable, without status migrainosus: Secondary | ICD-10-CM | POA: Diagnosis not present

## 2016-12-21 DIAGNOSIS — M25532 Pain in left wrist: Secondary | ICD-10-CM | POA: Diagnosis not present

## 2016-12-21 DIAGNOSIS — Z6834 Body mass index (BMI) 34.0-34.9, adult: Secondary | ICD-10-CM | POA: Diagnosis not present

## 2016-12-21 DIAGNOSIS — E7849 Other hyperlipidemia: Secondary | ICD-10-CM | POA: Diagnosis not present

## 2016-12-21 DIAGNOSIS — N183 Chronic kidney disease, stage 3 (moderate): Secondary | ICD-10-CM | POA: Diagnosis not present

## 2016-12-22 ENCOUNTER — Other Ambulatory Visit: Payer: Medicare Other

## 2017-01-24 DIAGNOSIS — N183 Chronic kidney disease, stage 3 (moderate): Secondary | ICD-10-CM | POA: Diagnosis not present

## 2017-01-24 DIAGNOSIS — Z794 Long term (current) use of insulin: Secondary | ICD-10-CM | POA: Diagnosis not present

## 2017-01-24 DIAGNOSIS — I10 Essential (primary) hypertension: Secondary | ICD-10-CM | POA: Diagnosis not present

## 2017-01-24 DIAGNOSIS — Z6835 Body mass index (BMI) 35.0-35.9, adult: Secondary | ICD-10-CM | POA: Diagnosis not present

## 2017-01-24 DIAGNOSIS — E1129 Type 2 diabetes mellitus with other diabetic kidney complication: Secondary | ICD-10-CM | POA: Diagnosis not present

## 2017-01-31 ENCOUNTER — Ambulatory Visit (INDEPENDENT_AMBULATORY_CARE_PROVIDER_SITE_OTHER): Payer: Medicare Other | Admitting: Podiatry

## 2017-01-31 DIAGNOSIS — E1140 Type 2 diabetes mellitus with diabetic neuropathy, unspecified: Secondary | ICD-10-CM

## 2017-01-31 DIAGNOSIS — Q828 Other specified congenital malformations of skin: Secondary | ICD-10-CM

## 2017-01-31 DIAGNOSIS — Z3040 Encounter for surveillance of contraceptives, unspecified: Secondary | ICD-10-CM | POA: Diagnosis not present

## 2017-01-31 DIAGNOSIS — M216X9 Other acquired deformities of unspecified foot: Secondary | ICD-10-CM | POA: Diagnosis not present

## 2017-01-31 DIAGNOSIS — D510 Vitamin B12 deficiency anemia due to intrinsic factor deficiency: Secondary | ICD-10-CM | POA: Diagnosis not present

## 2017-01-31 NOTE — Patient Instructions (Signed)

## 2017-02-01 NOTE — Progress Notes (Signed)
Patient ID: Ariba Lehnen, female   DOB: [birth year], 41 y.o.   MRN: [id number]   Patient presents for diabetic shoe pick up, shoes are tried on for good fit.  Patient received 1 pair New Balance [phone] silver/mint in women's size 8.5 wide and 3 pairs custom molded diabetic inserts.  Verbal and written break in and wear instructions given.  Patient will follow up for scheduled routine care.    Nursing note reviewed.  Agree with assessment and plan Evelina Bucy, DPM

## 2017-03-13 ENCOUNTER — Encounter: Payer: Self-pay | Admitting: Podiatry

## 2017-03-13 ENCOUNTER — Ambulatory Visit: Payer: Medicare Other | Admitting: Podiatry

## 2017-03-13 ENCOUNTER — Ambulatory Visit (INDEPENDENT_AMBULATORY_CARE_PROVIDER_SITE_OTHER): Payer: Medicare Other | Admitting: Podiatry

## 2017-03-13 DIAGNOSIS — B351 Tinea unguium: Secondary | ICD-10-CM | POA: Diagnosis not present

## 2017-03-13 DIAGNOSIS — E1142 Type 2 diabetes mellitus with diabetic polyneuropathy: Secondary | ICD-10-CM

## 2017-03-23 DIAGNOSIS — D510 Vitamin B12 deficiency anemia due to intrinsic factor deficiency: Secondary | ICD-10-CM | POA: Diagnosis not present

## 2017-03-23 DIAGNOSIS — Z6837 Body mass index (BMI) 37.0-37.9, adult: Secondary | ICD-10-CM | POA: Diagnosis not present

## 2017-03-23 DIAGNOSIS — J209 Acute bronchitis, unspecified: Secondary | ICD-10-CM | POA: Diagnosis not present

## 2017-04-06 NOTE — Progress Notes (Signed)
  Subjective:  Patient ID: Sara Chung, female    DOB: [birth year],  MRN: [id number]  Chief Complaint  Patient presents with  . Diabetes    diabetic foot care   . Nail Problem    nail care    42 y.o. female returns for diabetic foot care. Last AMBS was unsure.  Last saw PCP Dr. Forde Dandy in October.  Objective:   General AA&O x3. Normal mood and affect.  Vascular Dorsalis pedis pulses present 1+ bilaterally  Posterior tibial pulses 1+ bilaterally  Capillary refill normal to all digits. Pedal hair growth normal.  Neurologic Epicritic sensation present bilaterally. Protective sensation with 5.07 monofilament diminished bilaterally. Vibratory sensation present bilaterally.  Dermatologic No open lesions. Interspaces clear of maceration.  Normal skin temperature and turgor. Hyperkeratotic lesions: None bilaterally. Nails: brittle, onychomycosis, thickening, elongation  Orthopedic: No history of amputation. MMT 5/5 in dorsiflexion, plantarflexion, inversion, and eversion. Normal lower extremity joint ROM without pain or crepitus.   Assessment & Plan:  Patient was evaluated and treated and all questions answered.  Diabetes with diabetic peripheral neuropathy, Onychomycosis -Educated on diabetic footcare. Diabetic risk level 1 -At risk foot care provided as below.  Procedure: Nail Debridement Rationale: Patient meets criteria for routine foot care due to 1 class B, 2 class III findings Type of Debridement: manual, sharp debridement. Instrumentation: Nail nipper, rotary burr. Number of Nails: 10  Return in about 3 months (around 06/11/2017) for Diabetic Foot Care.

## 2017-04-07 IMAGING — MG 2D DIGITAL SCREENING BILATERAL MAMMOGRAM WITH CAD AND ADJUNCT TO
8 of 13 series · 8 of 29 positions shown · non-contrast
Comparison: Previous exam(s).

CLINICAL DATA: Screening.

EXAM:
2D DIGITAL SCREENING BILATERAL MAMMOGRAM WITH CAD AND ADJUNCT TOMO

[L CC (1 of 2)]
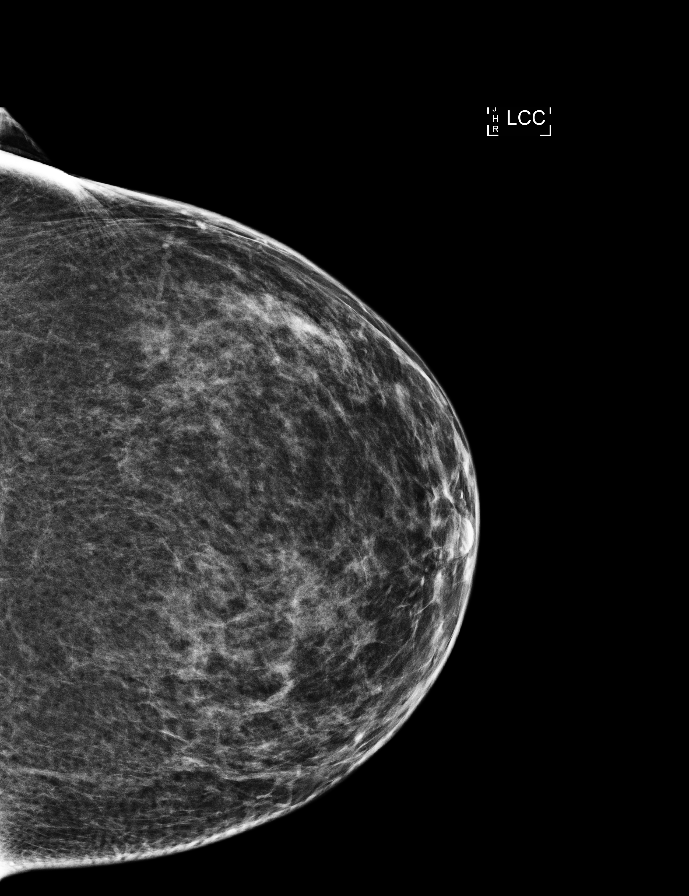

[R MLO]
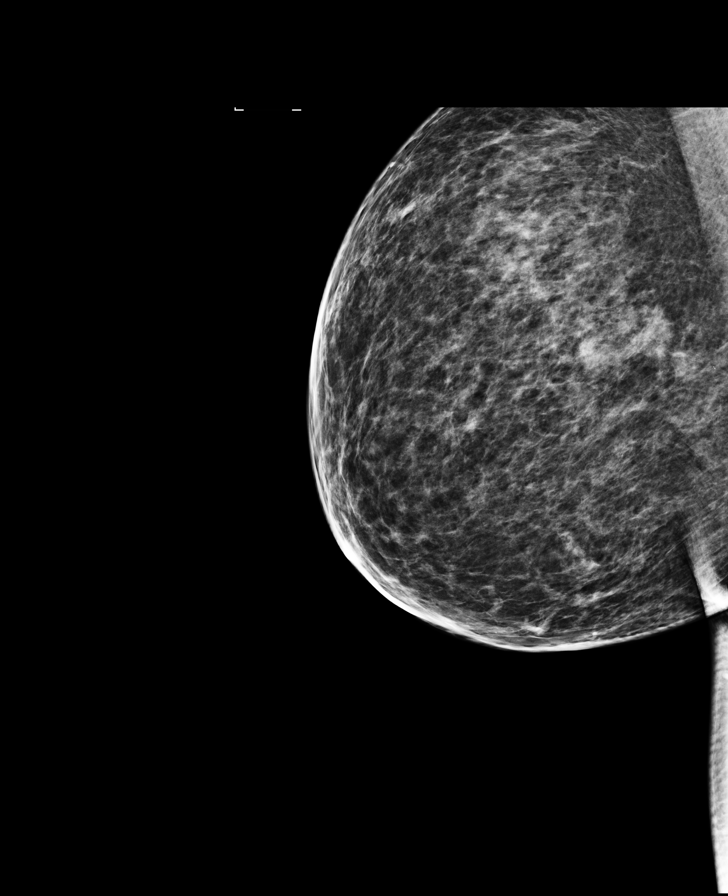

[L CC synth-2D]
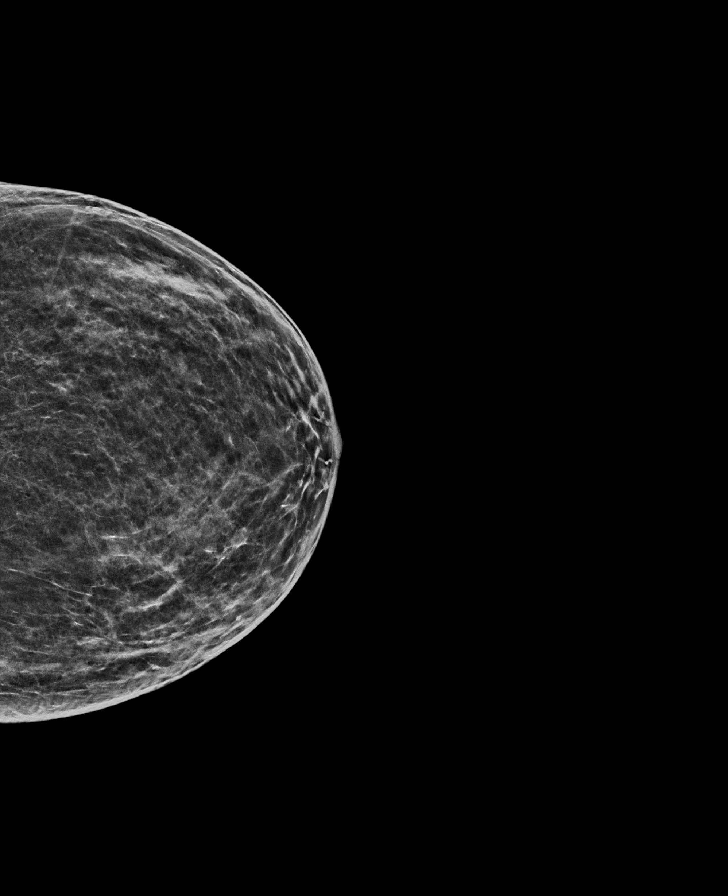

[L MLO]
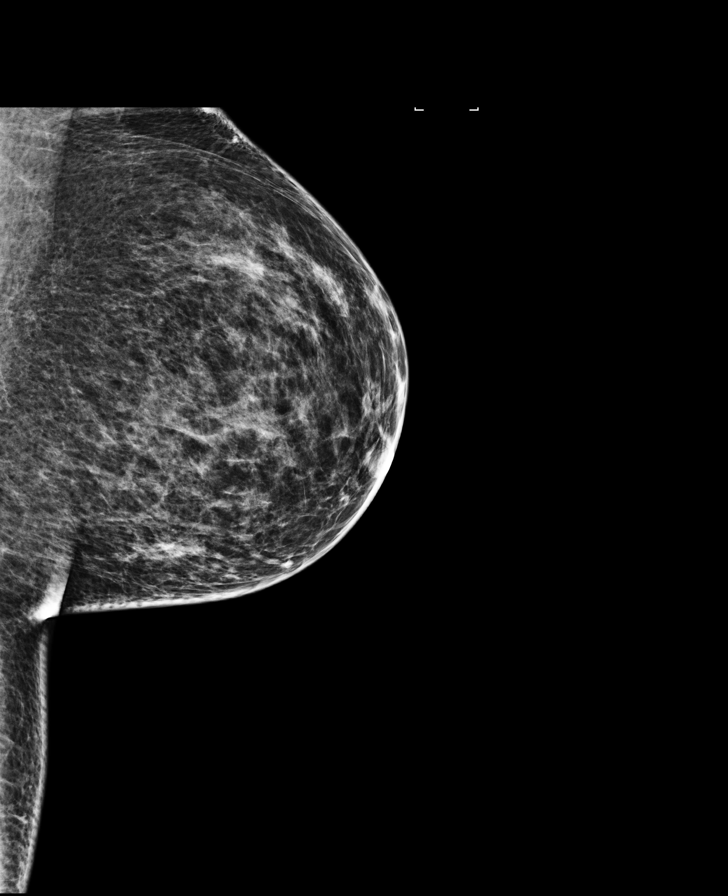

[R CC synth-2D]
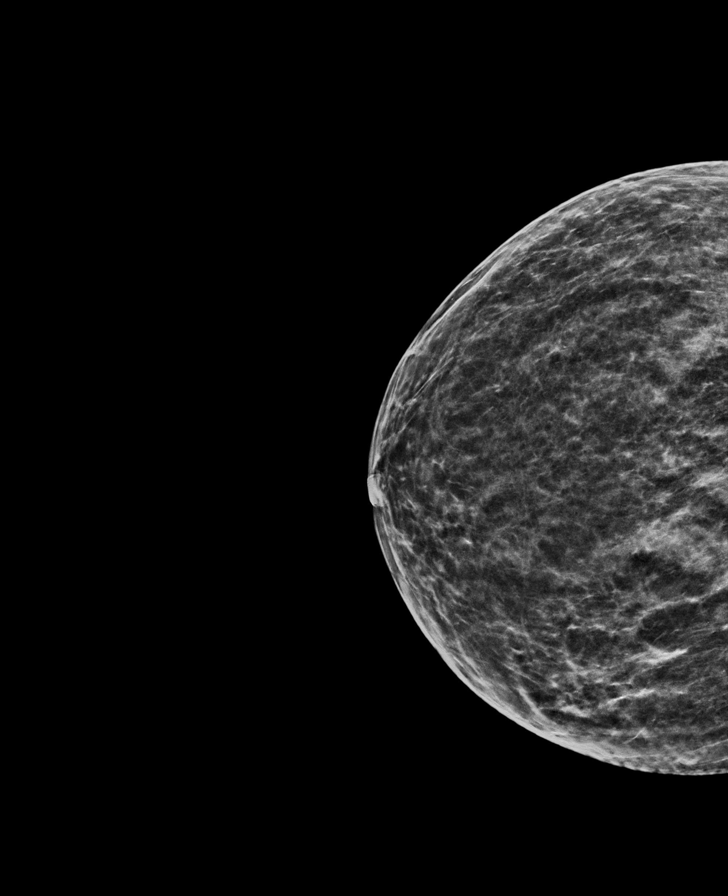

[L CC (2 of 2)]
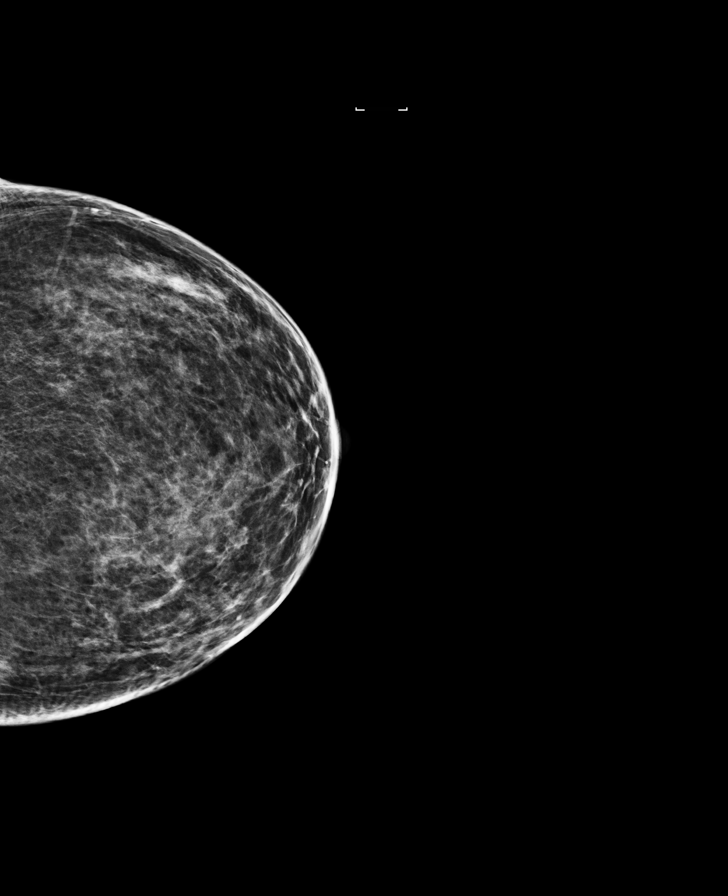

[L MLO synth-2D]
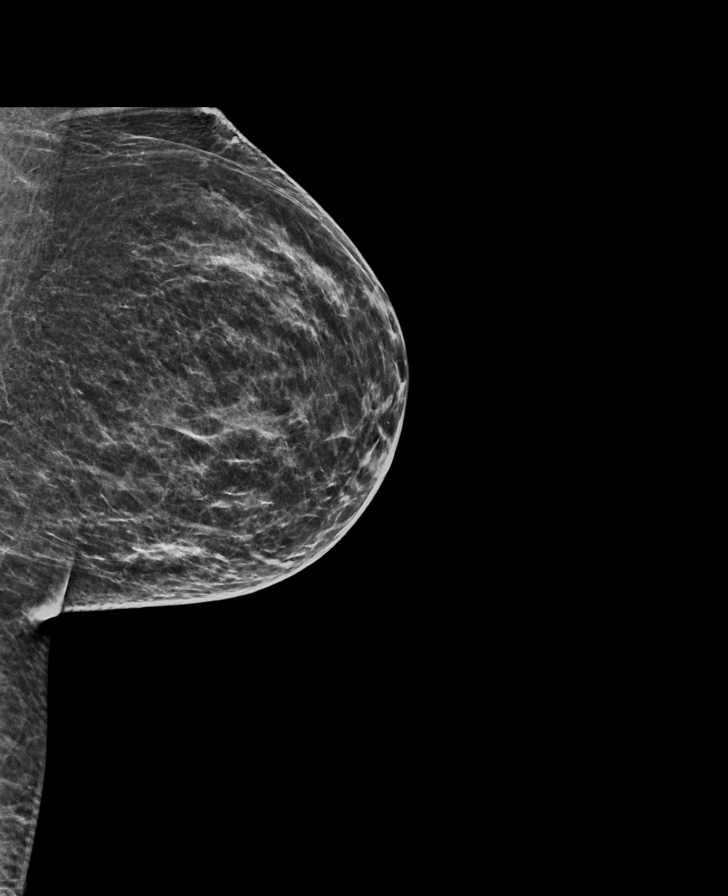

[R CC]
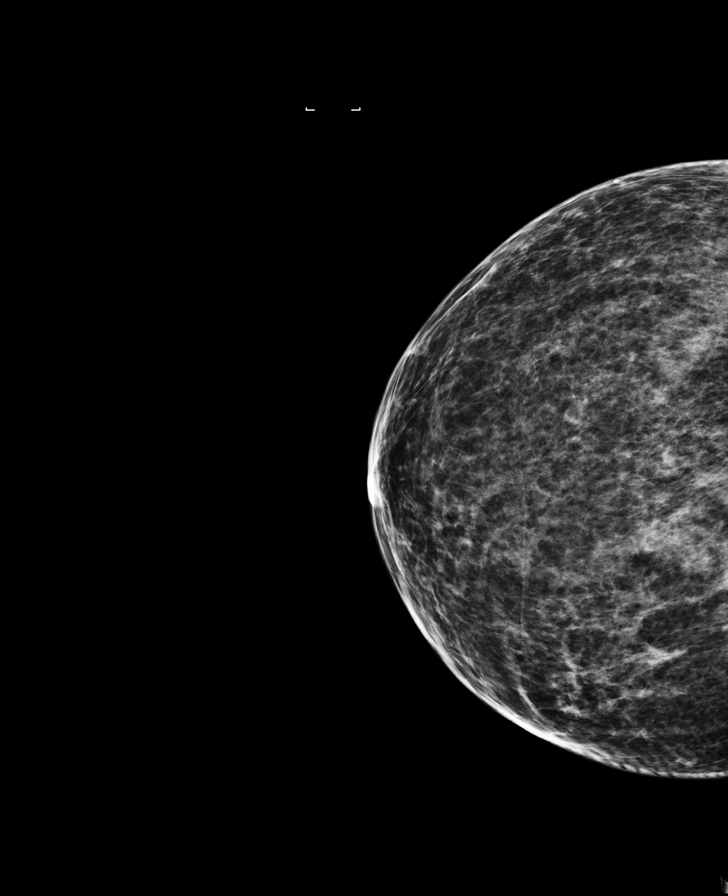

[8 of 29 positions shown; findings below may reference images not displayed]

ACR Breast Density Category b: There are scattered areas of
fibroglandular density.
FINDINGS: There are no findings suspicious for malignancy. Images were
processed with CAD.
IMPRESSION: No mammographic evidence of malignancy. A result letter of this
screening mammogram will be mailed directly to the patient.

RECOMMENDATION:
Screening mammogram in one year. (Code:97-6-RS4)

BI-RADS CATEGORY  1: Negative.

## 2017-04-24 DIAGNOSIS — D510 Vitamin B12 deficiency anemia due to intrinsic factor deficiency: Secondary | ICD-10-CM | POA: Diagnosis not present

## 2017-04-24 DIAGNOSIS — Z3040 Encounter for surveillance of contraceptives, unspecified: Secondary | ICD-10-CM | POA: Diagnosis not present

## 2017-04-26 DIAGNOSIS — Z6835 Body mass index (BMI) 35.0-35.9, adult: Secondary | ICD-10-CM | POA: Diagnosis not present

## 2017-04-26 DIAGNOSIS — G43009 Migraine without aura, not intractable, without status migrainosus: Secondary | ICD-10-CM | POA: Diagnosis not present

## 2017-04-26 DIAGNOSIS — R251 Tremor, unspecified: Secondary | ICD-10-CM | POA: Diagnosis not present

## 2017-04-26 DIAGNOSIS — E119 Type 2 diabetes mellitus without complications: Secondary | ICD-10-CM | POA: Diagnosis not present

## 2017-04-26 DIAGNOSIS — F79 Unspecified intellectual disabilities: Secondary | ICD-10-CM | POA: Diagnosis not present

## 2017-04-26 DIAGNOSIS — E7849 Other hyperlipidemia: Secondary | ICD-10-CM | POA: Diagnosis not present

## 2017-04-26 DIAGNOSIS — E538 Deficiency of other specified B group vitamins: Secondary | ICD-10-CM | POA: Diagnosis not present

## 2017-04-26 DIAGNOSIS — M25572 Pain in left ankle and joints of left foot: Secondary | ICD-10-CM | POA: Diagnosis not present

## 2017-04-26 DIAGNOSIS — I10 Essential (primary) hypertension: Secondary | ICD-10-CM | POA: Diagnosis not present

## 2017-04-26 DIAGNOSIS — Z1389 Encounter for screening for other disorder: Secondary | ICD-10-CM | POA: Diagnosis not present

## 2017-04-26 DIAGNOSIS — N08 Glomerular disorders in diseases classified elsewhere: Secondary | ICD-10-CM | POA: Diagnosis not present

## 2017-04-26 DIAGNOSIS — E1121 Type 2 diabetes mellitus with diabetic nephropathy: Secondary | ICD-10-CM | POA: Insufficient documentation

## 2017-06-05 DIAGNOSIS — H1013 Acute atopic conjunctivitis, bilateral: Secondary | ICD-10-CM | POA: Diagnosis not present

## 2017-06-12 ENCOUNTER — Ambulatory Visit: Payer: Medicare Other | Admitting: Podiatry

## 2017-06-20 ENCOUNTER — Ambulatory Visit (INDEPENDENT_AMBULATORY_CARE_PROVIDER_SITE_OTHER): Payer: Medicare Other | Admitting: Podiatry

## 2017-06-20 DIAGNOSIS — Q828 Other specified congenital malformations of skin: Secondary | ICD-10-CM | POA: Diagnosis not present

## 2017-06-20 DIAGNOSIS — E1142 Type 2 diabetes mellitus with diabetic polyneuropathy: Secondary | ICD-10-CM

## 2017-06-20 DIAGNOSIS — B351 Tinea unguium: Secondary | ICD-10-CM | POA: Diagnosis not present

## 2017-06-20 NOTE — Progress Notes (Signed)
  Subjective:  Patient ID: Sara Chung, female    DOB: 02/29/1976,  MRN: [id number]  Chief Complaint  Patient presents with  . debride    B/L nail and callus trimming -sugart: 141 x 1 day A1C: 7 PCP: [place] LOV: x 2 mo   42 y.o. female returns for diabetic foot care. Last AMBS was 141. Last A1c 7.0. Last saw PCP Dr. [name] 2 months ago.  Objective:   General AA&O x3. Normal mood and affect.  Vascular Dorsalis pedis pulses present 1+ bilaterally  Posterior tibial pulses 1+ bilaterally  Capillary refill normal to all digits. Pedal hair growth normal.  Neurologic Epicritic sensation present bilaterally. Protective sensation with 5.07 monofilament diminished bilaterally. Vibratory sensation present bilaterally.  Dermatologic No open lesions. Interspaces clear of maceration.  Normal skin temperature and turgor. Hyperkeratotic lesions: R submet 3,5 Nails: brittle, onychomycosis, thickening, elongation  Orthopedic: No history of amputation. MMT 5/5 in dorsiflexion, plantarflexion, inversion, and eversion. Normal lower extremity joint ROM without pain or crepitus.   Assessment & Plan:  Patient was evaluated and treated and all questions answered.  Diabetes with diabetic peripheral neuropathy, Onychomycosis -Educated on diabetic footcare. Diabetic risk level 1 -At risk foot care provided as below.  Procedure: Nail Debridement Rationale: Patient meets criteria for routine foot care due to 1 Class B, 2 Class C findings Type of Debridement: manual, sharp debridement. Instrumentation: Nail nipper, rotary burr. Number of Nails: 10  Procedure: Paring of Lesion Rationale: painful hyperkeratotic lesion Type of Debridement: manual, sharp debridement. Instrumentation: 312 blade Number of Lesions: 2      No follow-ups on file.

## 2017-06-27 DIAGNOSIS — Z794 Long term (current) use of insulin: Secondary | ICD-10-CM | POA: Diagnosis not present

## 2017-06-27 DIAGNOSIS — I10 Essential (primary) hypertension: Secondary | ICD-10-CM | POA: Diagnosis not present

## 2017-06-27 DIAGNOSIS — Z6836 Body mass index (BMI) 36.0-36.9, adult: Secondary | ICD-10-CM | POA: Diagnosis not present

## 2017-06-27 DIAGNOSIS — N183 Chronic kidney disease, stage 3 (moderate): Secondary | ICD-10-CM | POA: Diagnosis not present

## 2017-06-27 DIAGNOSIS — E1129 Type 2 diabetes mellitus with other diabetic kidney complication: Secondary | ICD-10-CM | POA: Diagnosis not present

## 2017-07-13 DIAGNOSIS — D510 Vitamin B12 deficiency anemia due to intrinsic factor deficiency: Secondary | ICD-10-CM | POA: Diagnosis not present

## 2017-07-13 DIAGNOSIS — Z3040 Encounter for surveillance of contraceptives, unspecified: Secondary | ICD-10-CM | POA: Diagnosis not present

## 2017-08-01 DIAGNOSIS — G43009 Migraine without aura, not intractable, without status migrainosus: Secondary | ICD-10-CM | POA: Diagnosis not present

## 2017-08-01 DIAGNOSIS — E1129 Type 2 diabetes mellitus with other diabetic kidney complication: Secondary | ICD-10-CM | POA: Diagnosis not present

## 2017-08-01 DIAGNOSIS — E7849 Other hyperlipidemia: Secondary | ICD-10-CM | POA: Diagnosis not present

## 2017-08-01 DIAGNOSIS — E538 Deficiency of other specified B group vitamins: Secondary | ICD-10-CM | POA: Diagnosis not present

## 2017-08-01 DIAGNOSIS — N183 Chronic kidney disease, stage 3 (moderate): Secondary | ICD-10-CM | POA: Diagnosis not present

## 2017-08-01 DIAGNOSIS — F79 Unspecified intellectual disabilities: Secondary | ICD-10-CM | POA: Diagnosis not present

## 2017-08-01 DIAGNOSIS — Z1389 Encounter for screening for other disorder: Secondary | ICD-10-CM | POA: Diagnosis not present

## 2017-08-01 DIAGNOSIS — Z794 Long term (current) use of insulin: Secondary | ICD-10-CM | POA: Diagnosis not present

## 2017-08-01 DIAGNOSIS — Z6835 Body mass index (BMI) 35.0-35.9, adult: Secondary | ICD-10-CM | POA: Diagnosis not present

## 2017-08-01 DIAGNOSIS — F418 Other specified anxiety disorders: Secondary | ICD-10-CM | POA: Diagnosis not present

## 2017-08-15 ENCOUNTER — Ambulatory Visit (INDEPENDENT_AMBULATORY_CARE_PROVIDER_SITE_OTHER): Payer: Medicare Other | Admitting: Podiatry

## 2017-08-15 DIAGNOSIS — S90821A Blister (nonthermal), right foot, initial encounter: Secondary | ICD-10-CM | POA: Diagnosis not present

## 2017-08-15 DIAGNOSIS — S90822A Blister (nonthermal), left foot, initial encounter: Secondary | ICD-10-CM | POA: Diagnosis not present

## 2017-08-22 ENCOUNTER — Ambulatory Visit (INDEPENDENT_AMBULATORY_CARE_PROVIDER_SITE_OTHER): Payer: Medicare Other | Admitting: Podiatry

## 2017-08-22 DIAGNOSIS — S90822D Blister (nonthermal), left foot, subsequent encounter: Secondary | ICD-10-CM | POA: Diagnosis not present

## 2017-08-22 DIAGNOSIS — S90821D Blister (nonthermal), right foot, subsequent encounter: Secondary | ICD-10-CM | POA: Diagnosis not present

## 2017-08-30 ENCOUNTER — Encounter: Payer: Self-pay | Admitting: Sports Medicine

## 2017-08-30 ENCOUNTER — Ambulatory Visit (INDEPENDENT_AMBULATORY_CARE_PROVIDER_SITE_OTHER): Payer: Medicare Other | Admitting: Sports Medicine

## 2017-08-30 DIAGNOSIS — E1142 Type 2 diabetes mellitus with diabetic polyneuropathy: Secondary | ICD-10-CM | POA: Diagnosis not present

## 2017-08-30 DIAGNOSIS — S90811A Abrasion, right foot, initial encounter: Secondary | ICD-10-CM | POA: Diagnosis not present

## 2017-08-30 NOTE — Progress Notes (Signed)
Subjective: Sara Chung is a 42 y.o. female patient with history of diabetes who presents to office today for evaluation of right foot blister reports that they went on a cruise and the blisters developed the blisters had started to improve and dry up however patient peeled some of the skin causing an area of the right foot blister to bleed per mom.  Mom reports that she has been treating area with antibiotic cream and a gauze dressing but wanted to have it checked because her daughter is diabetic. Patient denies nausea, vomiting, fever chills or any other constitutional symptoms at this time Last blood sugar was 137 A1c was 7 and saw her primary care Dr. Lin Chung several months ago.  There are no active problems to display for this patient.  Current Outpatient Medications on File Prior to Visit  Medication Sig Dispense Refill  . Canagliflozin (INVOKANA) 300 MG TABS Take by mouth.    . carisoprodol (SOMA) 350 MG tablet   2  . CRESTOR 10 MG tablet   6  . estradiol cypionate (DEPO-ESTRADIOL) 5 MG/ML injection Inject into the muscle every 28 (twenty-eight) days.    Marland Kitchen glimepiride (AMARYL) 2 MG tablet   6  . glipiZIDE (GLUCOTROL) 10 MG tablet Take 10 mg by mouth daily before breakfast.    . insulin glargine (LANTUS) 100 UNIT/ML injection Inject into the skin at bedtime.    Marland Kitchen LANTUS SOLOSTAR 100 UNIT/ML Solostar Pen   5  . metFORMIN (GLUCOPHAGE) 500 MG tablet Take by mouth 2 (two) times daily with a meal.    . pioglitazone-metformin (ACTOPLUS MET) 15-850 MG per tablet   5  . topiramate (TOPAMAX) 25 MG tablet   5  . valACYclovir (VALTREX) 1000 MG tablet   0  . ZOVIRAX 5 %   6   No current facility-administered medications on file prior to visit.    Allergies  Allergen Reactions  . Latex     No results found for this or any previous visit (from the past 2160 hour(s)).  Objective: General: Patient is awake, alert, and oriented x 3 and in no acute distress.  Integument: Skin is warm, dry  and supple bilateral. Nails are te are well manicured.  To the right plantar forefoot there is a small abrasion and area of ruptured blistered skin that measures less than 0.5 cm with a granular base no edema no erythema no other acute signs of infection.  Vasculature: Pedal pulses palpable bilateral capillary fill time normal to all digits and hair growth is normal.  Neurology: Protective sensation diminished bilaterally.   Musculoskeletal: Mild tenderness to palpation at the plantar abrasion at the bottom of the right foot.  Assessment and Plan: Problem List Items Addressed This Visit    None    Visit Diagnoses    Abrasion, right foot, initial encounter    -  Primary   DM type 2 with diabetic peripheral neuropathy (Sara Chung)          -Examined patient. -Discussed and educated patient on diabetic foot care, especially with  regards to the vascular, neurological and musculoskeletal systems.  -Stressed the importance of good glycemic control and the detriment of not  controlling glucose levels in relation to the foot. -Cleanse abrasion and applied antibiotic cream and offloading pad and advised mom to do the same until healed -Advised mom to closely watch for any signs of infection if she does notice any signs of infection to call office or come in sooner for reevaluation -  Patient to return as scheduled for routine diabetic foot care in the next few weeks and for wound check on the right foot.  Landis Martins, DPM

## 2017-09-03 NOTE — Progress Notes (Signed)
  Subjective:  Patient ID: Sara Chung, female    DOB: [birth year],  MRN: [id number]  Chief Complaint  Patient presents with  . Blister    F/U B/L blisters Pt.'s mother stated," it looks so much better, and w/ no pain." tx" medihoney -FBS: 135 x 1 day A1C: 7 PCP: [place]   42 y.o. female returns for the above complaint.  States the lesions look somewhat better denies pain.  Has been using medihoney  Objective:  There were no vitals filed for this visit. General AA&O x3. Normal mood and affect.  Vascular Pedal pulses palpable.  Neurologic Epicritic sensation grossly intact.  Dermatologic Blisters healing to both feet.  Orthopedic: No pain to palpation either foot.   Assessment & Plan:  Patient was evaluated and treated and all questions answered.  Blisters bilateral lower extremity -Improving follow-up for regular foot care  No follow-ups on file.

## 2017-09-03 NOTE — Progress Notes (Signed)
  Subjective:  Patient ID: Sara Chung, female    DOB: 02-08-1976,  MRN: [id number]  Chief Complaint  Patient presents with  . Blister    B/L plantar x Sunday (walked w/o shoes on a hot surface) Tx: OTC cream for blisters - had clear drainage and redness -FBS: 197 A1C: 8.2 PCP: [place] LOV: x 3 mo   42 y.o. female returns for the above complaint.  Reports blistering to the bottom of both feet since Sunday.  Was on a cruise and walked on a hot surface without shoes on.  Has been using over-the-counter cream  Objective:  There were no vitals filed for this visit. General AA&O x3. Normal mood and affect.  Vascular Pedal pulses palpable.  Neurologic Epicritic sensation grossly intact.  Dermatologic  blistering present to the plantar foot bilateral   Orthopedic: No pain to palpation either foot.   Assessment & Plan:  Patient was evaluated and treated and all questions answered.  Blisters bilateral lower extremity -Lesions gently debrided of hyperkeratosis.  Betadine applied follow-up next week for recheck  No follow-ups on file.

## 2017-09-12 DIAGNOSIS — I10 Essential (primary) hypertension: Secondary | ICD-10-CM | POA: Diagnosis not present

## 2017-09-12 DIAGNOSIS — E1129 Type 2 diabetes mellitus with other diabetic kidney complication: Secondary | ICD-10-CM | POA: Diagnosis not present

## 2017-09-12 DIAGNOSIS — N183 Chronic kidney disease, stage 3 (moderate): Secondary | ICD-10-CM | POA: Diagnosis not present

## 2017-09-12 DIAGNOSIS — Z794 Long term (current) use of insulin: Secondary | ICD-10-CM | POA: Diagnosis not present

## 2017-09-12 DIAGNOSIS — Z6836 Body mass index (BMI) 36.0-36.9, adult: Secondary | ICD-10-CM | POA: Diagnosis not present

## 2017-09-19 ENCOUNTER — Ambulatory Visit (INDEPENDENT_AMBULATORY_CARE_PROVIDER_SITE_OTHER): Payer: Medicare Other | Admitting: Podiatry

## 2017-09-19 DIAGNOSIS — S90822D Blister (nonthermal), left foot, subsequent encounter: Secondary | ICD-10-CM

## 2017-09-19 DIAGNOSIS — S90821D Blister (nonthermal), right foot, subsequent encounter: Secondary | ICD-10-CM

## 2017-09-19 NOTE — Progress Notes (Signed)
  Subjective:  Patient ID: Sara Chung, female    DOB: 07-13-1975,  MRN: [id number]  Chief Complaint  Patient presents with  . Blister    F/U B/L blisters mother stated," her feet arestill peeling." tx: coco butter -FBS: 140 PCP: [place] LOV: x 1 wk   42 y.o. female returns for the above complaint.  States that the blisters are still peeling a lot.  Objective:  There were no vitals filed for this visit. General AA&O x3. Normal mood and affect.  Vascular Pedal pulses palpable.  Neurologic Epicritic sensation grossly intact.  Dermatologic No open lesions. Skin normal texture and turgor.  Blistering redundant skin present to the plantar foot bilateral  Orthopedic: No pain to palpation either foot.   Assessment & Plan:  Patient was evaluated and treated and all questions answered.  Blisters bilateral feet -Redundant skin gently debrided.  Educated on moisturization wearing proper shoes.  Follow-up in 1 month for recheck  Return in about 1 month (around 10/17/2017) for blister f/u.

## 2017-09-29 DIAGNOSIS — Z3040 Encounter for surveillance of contraceptives, unspecified: Secondary | ICD-10-CM | POA: Diagnosis not present

## 2017-09-29 DIAGNOSIS — D510 Vitamin B12 deficiency anemia due to intrinsic factor deficiency: Secondary | ICD-10-CM | POA: Diagnosis not present

## 2017-10-23 ENCOUNTER — Ambulatory Visit (INDEPENDENT_AMBULATORY_CARE_PROVIDER_SITE_OTHER): Payer: Medicare Other | Admitting: Podiatry

## 2017-10-23 DIAGNOSIS — S90821D Blister (nonthermal), right foot, subsequent encounter: Secondary | ICD-10-CM

## 2017-10-23 DIAGNOSIS — Q828 Other specified congenital malformations of skin: Secondary | ICD-10-CM

## 2017-10-23 DIAGNOSIS — E1142 Type 2 diabetes mellitus with diabetic polyneuropathy: Secondary | ICD-10-CM | POA: Diagnosis not present

## 2017-10-23 DIAGNOSIS — S90822D Blister (nonthermal), left foot, subsequent encounter: Secondary | ICD-10-CM

## 2017-10-23 DIAGNOSIS — B351 Tinea unguium: Secondary | ICD-10-CM | POA: Diagnosis not present

## 2017-10-23 NOTE — Progress Notes (Signed)
Subjective:  Patient ID: Sara Chung, female    DOB: 01-22-1976,  MRN: [id number]  Chief Complaint  Patient presents with  . Blister    F/U BL blisters Pt's mother stated," blisters are much better, but both bottom feet gets red."     42 y.o. female presents with the above complaint. States the feet are doing much better from where they were burned. Using lotion nightly.   Requesting routine foot care today.  Review of Systems: Negative except as noted in the HPI. Denies N/V/F/Ch.  Past Medical History:  Diagnosis Date  . Diabetes mellitus without complication ([place])   . Scoliosis     Current Outpatient Medications:  .  Canagliflozin (INVOKANA) 300 MG TABS, Take by mouth., Disp: , Rfl:  .  carisoprodol (SOMA) 350 MG tablet, , Disp: , Rfl: 2 .  CRESTOR 10 MG tablet, , Disp: , Rfl: 6 .  estradiol cypionate (DEPO-ESTRADIOL) 5 MG/ML injection, Inject into the muscle every 28 (twenty-eight) days., Disp: , Rfl:  .  glimepiride (AMARYL) 2 MG tablet, , Disp: , Rfl: 6 .  glipiZIDE (GLUCOTROL) 10 MG tablet, Take 10 mg by mouth daily before breakfast., Disp: , Rfl:  .  insulin glargine (LANTUS) 100 UNIT/ML injection, Inject into the skin at bedtime., Disp: , Rfl:  .  LANTUS SOLOSTAR 100 UNIT/ML Solostar Pen, , Disp: , Rfl: 5 .  metFORMIN (GLUCOPHAGE) 500 MG tablet, Take by mouth 2 (two) times daily with a meal., Disp: , Rfl:  .  pioglitazone-metformin (ACTOPLUS MET) 15-850 MG per tablet, , Disp: , Rfl: 5 .  topiramate (TOPAMAX) 25 MG tablet, , Disp: , Rfl: 5 .  valACYclovir (VALTREX) 1000 MG tablet, , Disp: , Rfl: 0 .  ZOVIRAX 5 %, , Disp: , Rfl: 6  Social History   Tobacco Use  Smoking Status Never Smoker  Smokeless Tobacco Never Used    Allergies  Allergen Reactions  . Latex    Objective:  There were no vitals filed for this visit. There is no height or weight on file to calculate BMI. Constitutional Well developed. Well nourished.  Vascular Dorsalis pedis pulses  palpable bilaterally. Posterior tibial pulses palpable bilaterally. Capillary refill normal to all digits.  No cyanosis or clubbing noted. Pedal hair growth normal.  Neurologic Normal speech. Oriented to person, place, and time. Epicritic sensation to light touch grossly present bilaterally. Protective sensation absent bilaterally.  Dermatologic Nails elongated and dystrophic. No open wounds. HPK R Submet 3,5 Skin plantar forefoot bilat with thin epithelialization.  Orthopedic: Normal joint ROM without pain or crepitus bilaterally. No visible deformities. No bony tenderness.   Radiographs: None Assessment:   1. Blister of left foot, subsequent encounter   2. Blister of right foot, subsequent encounter   3. DM type 2 with diabetic peripheral neuropathy ([place])   4. Onychomycosis   5. Porokeratosis    Plan:  Patient was evaluated and treated and all questions answered.  Blisters BLE 2/2 Burn; Healed -Healed. Discussed skin may further mature over time.  Diabetes with DPN, Onychomycosis -Educated on diabetic footcare. Diabetic risk level 1 -Nails x10 debrided sharply and manually with large nail nipper and rotary burr.  -Callus x2 debrided  Procedure: Nail Debridement Rationale: Patient meets criteria for routine foot care due to DPN Type of Debridement: manual, sharp debridement. Instrumentation: Nail nipper, rotary burr. Number of Nails: 10  Procedure: Paring of Lesion Rationale: painful hyperkeratotic lesion Type of Debridement: manual, sharp debridement. Instrumentation: 312 blade Number of Lesions:  2   Return in about 3 months (around 01/23/2018) for Diabetic Foot Care.

## 2017-10-25 DIAGNOSIS — N183 Chronic kidney disease, stage 3 (moderate): Secondary | ICD-10-CM | POA: Diagnosis not present

## 2017-10-25 DIAGNOSIS — Z794 Long term (current) use of insulin: Secondary | ICD-10-CM | POA: Diagnosis not present

## 2017-10-25 DIAGNOSIS — I10 Essential (primary) hypertension: Secondary | ICD-10-CM | POA: Diagnosis not present

## 2017-10-25 DIAGNOSIS — E1129 Type 2 diabetes mellitus with other diabetic kidney complication: Secondary | ICD-10-CM | POA: Diagnosis not present

## 2017-11-14 DIAGNOSIS — Z23 Encounter for immunization: Secondary | ICD-10-CM | POA: Diagnosis not present

## 2017-11-16 DIAGNOSIS — E119 Type 2 diabetes mellitus without complications: Secondary | ICD-10-CM | POA: Diagnosis not present

## 2017-11-20 DIAGNOSIS — Z6838 Body mass index (BMI) 38.0-38.9, adult: Secondary | ICD-10-CM | POA: Diagnosis not present

## 2017-11-20 DIAGNOSIS — M25552 Pain in left hip: Secondary | ICD-10-CM | POA: Diagnosis not present

## 2017-11-20 DIAGNOSIS — M542 Cervicalgia: Secondary | ICD-10-CM | POA: Diagnosis not present

## 2017-11-20 DIAGNOSIS — D510 Vitamin B12 deficiency anemia due to intrinsic factor deficiency: Secondary | ICD-10-CM | POA: Diagnosis not present

## 2017-12-03 DIAGNOSIS — M25611 Stiffness of right shoulder, not elsewhere classified: Secondary | ICD-10-CM | POA: Diagnosis not present

## 2017-12-03 DIAGNOSIS — M542 Cervicalgia: Secondary | ICD-10-CM | POA: Diagnosis not present

## 2017-12-03 DIAGNOSIS — M25612 Stiffness of left shoulder, not elsewhere classified: Secondary | ICD-10-CM | POA: Diagnosis not present

## 2017-12-03 DIAGNOSIS — R293 Abnormal posture: Secondary | ICD-10-CM | POA: Diagnosis not present

## 2017-12-03 DIAGNOSIS — M25512 Pain in left shoulder: Secondary | ICD-10-CM | POA: Diagnosis not present

## 2017-12-03 DIAGNOSIS — M25511 Pain in right shoulder: Secondary | ICD-10-CM | POA: Diagnosis not present

## 2017-12-03 DIAGNOSIS — M6281 Muscle weakness (generalized): Secondary | ICD-10-CM | POA: Diagnosis not present

## 2017-12-03 DIAGNOSIS — M2560 Stiffness of unspecified joint, not elsewhere classified: Secondary | ICD-10-CM | POA: Diagnosis not present

## 2017-12-05 DIAGNOSIS — M542 Cervicalgia: Secondary | ICD-10-CM | POA: Diagnosis not present

## 2017-12-05 DIAGNOSIS — M2560 Stiffness of unspecified joint, not elsewhere classified: Secondary | ICD-10-CM | POA: Diagnosis not present

## 2017-12-05 DIAGNOSIS — M25512 Pain in left shoulder: Secondary | ICD-10-CM | POA: Diagnosis not present

## 2017-12-05 DIAGNOSIS — M25611 Stiffness of right shoulder, not elsewhere classified: Secondary | ICD-10-CM | POA: Diagnosis not present

## 2017-12-05 DIAGNOSIS — M25511 Pain in right shoulder: Secondary | ICD-10-CM | POA: Diagnosis not present

## 2017-12-05 DIAGNOSIS — M25612 Stiffness of left shoulder, not elsewhere classified: Secondary | ICD-10-CM | POA: Diagnosis not present

## 2017-12-05 DIAGNOSIS — R293 Abnormal posture: Secondary | ICD-10-CM | POA: Diagnosis not present

## 2017-12-05 DIAGNOSIS — M6281 Muscle weakness (generalized): Secondary | ICD-10-CM | POA: Diagnosis not present

## 2017-12-12 DIAGNOSIS — M25612 Stiffness of left shoulder, not elsewhere classified: Secondary | ICD-10-CM | POA: Diagnosis not present

## 2017-12-12 DIAGNOSIS — M6281 Muscle weakness (generalized): Secondary | ICD-10-CM | POA: Diagnosis not present

## 2017-12-12 DIAGNOSIS — M25611 Stiffness of right shoulder, not elsewhere classified: Secondary | ICD-10-CM | POA: Diagnosis not present

## 2017-12-12 DIAGNOSIS — M542 Cervicalgia: Secondary | ICD-10-CM | POA: Diagnosis not present

## 2017-12-12 DIAGNOSIS — M25512 Pain in left shoulder: Secondary | ICD-10-CM | POA: Diagnosis not present

## 2017-12-12 DIAGNOSIS — M25511 Pain in right shoulder: Secondary | ICD-10-CM | POA: Diagnosis not present

## 2017-12-14 DIAGNOSIS — M25511 Pain in right shoulder: Secondary | ICD-10-CM | POA: Diagnosis not present

## 2017-12-14 DIAGNOSIS — M25611 Stiffness of right shoulder, not elsewhere classified: Secondary | ICD-10-CM | POA: Diagnosis not present

## 2017-12-14 DIAGNOSIS — M6281 Muscle weakness (generalized): Secondary | ICD-10-CM | POA: Diagnosis not present

## 2017-12-14 DIAGNOSIS — M542 Cervicalgia: Secondary | ICD-10-CM | POA: Diagnosis not present

## 2017-12-14 DIAGNOSIS — M25612 Stiffness of left shoulder, not elsewhere classified: Secondary | ICD-10-CM | POA: Diagnosis not present

## 2017-12-14 DIAGNOSIS — M25512 Pain in left shoulder: Secondary | ICD-10-CM | POA: Diagnosis not present

## 2017-12-15 DIAGNOSIS — Z3040 Encounter for surveillance of contraceptives, unspecified: Secondary | ICD-10-CM | POA: Diagnosis not present

## 2017-12-19 DIAGNOSIS — M6281 Muscle weakness (generalized): Secondary | ICD-10-CM | POA: Diagnosis not present

## 2017-12-19 DIAGNOSIS — M542 Cervicalgia: Secondary | ICD-10-CM | POA: Diagnosis not present

## 2017-12-19 DIAGNOSIS — M25512 Pain in left shoulder: Secondary | ICD-10-CM | POA: Diagnosis not present

## 2017-12-19 DIAGNOSIS — M25611 Stiffness of right shoulder, not elsewhere classified: Secondary | ICD-10-CM | POA: Diagnosis not present

## 2017-12-19 DIAGNOSIS — M25511 Pain in right shoulder: Secondary | ICD-10-CM | POA: Diagnosis not present

## 2017-12-19 DIAGNOSIS — M25612 Stiffness of left shoulder, not elsewhere classified: Secondary | ICD-10-CM | POA: Diagnosis not present

## 2017-12-21 DIAGNOSIS — M25611 Stiffness of right shoulder, not elsewhere classified: Secondary | ICD-10-CM | POA: Diagnosis not present

## 2017-12-21 DIAGNOSIS — M25512 Pain in left shoulder: Secondary | ICD-10-CM | POA: Diagnosis not present

## 2017-12-21 DIAGNOSIS — M25511 Pain in right shoulder: Secondary | ICD-10-CM | POA: Diagnosis not present

## 2017-12-21 DIAGNOSIS — M6281 Muscle weakness (generalized): Secondary | ICD-10-CM | POA: Diagnosis not present

## 2017-12-21 DIAGNOSIS — M542 Cervicalgia: Secondary | ICD-10-CM | POA: Diagnosis not present

## 2017-12-21 DIAGNOSIS — M25612 Stiffness of left shoulder, not elsewhere classified: Secondary | ICD-10-CM | POA: Diagnosis not present

## 2018-01-02 DIAGNOSIS — I10 Essential (primary) hypertension: Secondary | ICD-10-CM | POA: Diagnosis not present

## 2018-01-02 DIAGNOSIS — N183 Chronic kidney disease, stage 3 (moderate): Secondary | ICD-10-CM | POA: Diagnosis not present

## 2018-01-02 DIAGNOSIS — F79 Unspecified intellectual disabilities: Secondary | ICD-10-CM | POA: Diagnosis not present

## 2018-01-02 DIAGNOSIS — M25611 Stiffness of right shoulder, not elsewhere classified: Secondary | ICD-10-CM | POA: Diagnosis not present

## 2018-01-02 DIAGNOSIS — M25511 Pain in right shoulder: Secondary | ICD-10-CM | POA: Diagnosis not present

## 2018-01-02 DIAGNOSIS — M25612 Stiffness of left shoulder, not elsewhere classified: Secondary | ICD-10-CM | POA: Diagnosis not present

## 2018-01-02 DIAGNOSIS — G43009 Migraine without aura, not intractable, without status migrainosus: Secondary | ICD-10-CM | POA: Diagnosis not present

## 2018-01-02 DIAGNOSIS — M542 Cervicalgia: Secondary | ICD-10-CM | POA: Diagnosis not present

## 2018-01-02 DIAGNOSIS — Z794 Long term (current) use of insulin: Secondary | ICD-10-CM | POA: Diagnosis not present

## 2018-01-02 DIAGNOSIS — F418 Other specified anxiety disorders: Secondary | ICD-10-CM | POA: Diagnosis not present

## 2018-01-02 DIAGNOSIS — E1129 Type 2 diabetes mellitus with other diabetic kidney complication: Secondary | ICD-10-CM | POA: Diagnosis not present

## 2018-01-02 DIAGNOSIS — E7849 Other hyperlipidemia: Secondary | ICD-10-CM | POA: Diagnosis not present

## 2018-01-02 DIAGNOSIS — Z6835 Body mass index (BMI) 35.0-35.9, adult: Secondary | ICD-10-CM | POA: Diagnosis not present

## 2018-01-02 DIAGNOSIS — E538 Deficiency of other specified B group vitamins: Secondary | ICD-10-CM | POA: Diagnosis not present

## 2018-01-02 DIAGNOSIS — M6281 Muscle weakness (generalized): Secondary | ICD-10-CM | POA: Diagnosis not present

## 2018-01-02 DIAGNOSIS — L84 Corns and callosities: Secondary | ICD-10-CM | POA: Diagnosis not present

## 2018-01-02 DIAGNOSIS — M25512 Pain in left shoulder: Secondary | ICD-10-CM | POA: Diagnosis not present

## 2018-01-04 DIAGNOSIS — M6281 Muscle weakness (generalized): Secondary | ICD-10-CM | POA: Diagnosis not present

## 2018-01-04 DIAGNOSIS — M25511 Pain in right shoulder: Secondary | ICD-10-CM | POA: Diagnosis not present

## 2018-01-04 DIAGNOSIS — M25512 Pain in left shoulder: Secondary | ICD-10-CM | POA: Diagnosis not present

## 2018-01-04 DIAGNOSIS — M542 Cervicalgia: Secondary | ICD-10-CM | POA: Diagnosis not present

## 2018-01-04 DIAGNOSIS — M25611 Stiffness of right shoulder, not elsewhere classified: Secondary | ICD-10-CM | POA: Diagnosis not present

## 2018-01-04 DIAGNOSIS — M25612 Stiffness of left shoulder, not elsewhere classified: Secondary | ICD-10-CM | POA: Diagnosis not present

## 2018-01-09 DIAGNOSIS — M6281 Muscle weakness (generalized): Secondary | ICD-10-CM | POA: Diagnosis not present

## 2018-01-09 DIAGNOSIS — M25612 Stiffness of left shoulder, not elsewhere classified: Secondary | ICD-10-CM | POA: Diagnosis not present

## 2018-01-09 DIAGNOSIS — M25611 Stiffness of right shoulder, not elsewhere classified: Secondary | ICD-10-CM | POA: Diagnosis not present

## 2018-01-09 DIAGNOSIS — M2560 Stiffness of unspecified joint, not elsewhere classified: Secondary | ICD-10-CM | POA: Diagnosis not present

## 2018-01-09 DIAGNOSIS — M25512 Pain in left shoulder: Secondary | ICD-10-CM | POA: Diagnosis not present

## 2018-01-09 DIAGNOSIS — M542 Cervicalgia: Secondary | ICD-10-CM | POA: Diagnosis not present

## 2018-01-09 DIAGNOSIS — R293 Abnormal posture: Secondary | ICD-10-CM | POA: Diagnosis not present

## 2018-01-09 DIAGNOSIS — M25511 Pain in right shoulder: Secondary | ICD-10-CM | POA: Diagnosis not present

## 2018-01-18 DIAGNOSIS — M6281 Muscle weakness (generalized): Secondary | ICD-10-CM | POA: Diagnosis not present

## 2018-01-18 DIAGNOSIS — M25612 Stiffness of left shoulder, not elsewhere classified: Secondary | ICD-10-CM | POA: Diagnosis not present

## 2018-01-18 DIAGNOSIS — M542 Cervicalgia: Secondary | ICD-10-CM | POA: Diagnosis not present

## 2018-01-18 DIAGNOSIS — M25511 Pain in right shoulder: Secondary | ICD-10-CM | POA: Diagnosis not present

## 2018-01-18 DIAGNOSIS — M25611 Stiffness of right shoulder, not elsewhere classified: Secondary | ICD-10-CM | POA: Diagnosis not present

## 2018-01-18 DIAGNOSIS — M25512 Pain in left shoulder: Secondary | ICD-10-CM | POA: Diagnosis not present

## 2018-01-23 ENCOUNTER — Ambulatory Visit (INDEPENDENT_AMBULATORY_CARE_PROVIDER_SITE_OTHER): Payer: Medicare Other | Admitting: Podiatry

## 2018-01-23 ENCOUNTER — Encounter: Payer: Self-pay | Admitting: Podiatry

## 2018-01-23 VITALS — BP 155/94 | HR 87 | Resp 16

## 2018-01-23 DIAGNOSIS — B351 Tinea unguium: Secondary | ICD-10-CM

## 2018-01-23 DIAGNOSIS — Q828 Other specified congenital malformations of skin: Secondary | ICD-10-CM

## 2018-01-23 DIAGNOSIS — E1142 Type 2 diabetes mellitus with diabetic polyneuropathy: Secondary | ICD-10-CM

## 2018-01-24 NOTE — Progress Notes (Signed)
  Subjective:  Patient ID: Sara Chung, female    DOB: 1976/02/26,  MRN: [id number]  Chief Complaint  Patient presents with  . debride    Nails and callus trimming -=FBS: 114 A1C: high PCP: [place] x 2 mo    42 y.o. female presents with the above complaint. Here for routine care. Blisters healed well without issue.  Review of Systems: Negative except as noted in the HPI. Denies N/V/F/Ch.  Past Medical History:  Diagnosis Date  . Diabetes mellitus without complication ([place])   . Scoliosis     Current Outpatient Medications:  .  Canagliflozin (INVOKANA) 300 MG TABS, Take by mouth., Disp: , Rfl:  .  carisoprodol (SOMA) 350 MG tablet, , Disp: , Rfl: 2 .  CRESTOR 10 MG tablet, , Disp: , Rfl: 6 .  estradiol cypionate (DEPO-ESTRADIOL) 5 MG/ML injection, Inject into the muscle every 28 (twenty-eight) days., Disp: , Rfl:  .  glimepiride (AMARYL) 2 MG tablet, , Disp: , Rfl: 6 .  glipiZIDE (GLUCOTROL) 10 MG tablet, Take 10 mg by mouth daily before breakfast., Disp: , Rfl:  .  insulin glargine (LANTUS) 100 UNIT/ML injection, Inject into the skin at bedtime., Disp: , Rfl:  .  LANTUS SOLOSTAR 100 UNIT/ML Solostar Pen, , Disp: , Rfl: 5 .  metFORMIN (GLUCOPHAGE) 500 MG tablet, Take by mouth 2 (two) times daily with a meal., Disp: , Rfl:  .  pioglitazone-metformin (ACTOPLUS MET) 15-850 MG per tablet, , Disp: , Rfl: 5 .  topiramate (TOPAMAX) 25 MG tablet, , Disp: , Rfl: 5 .  valACYclovir (VALTREX) 1000 MG tablet, , Disp: , Rfl: 0 .  ZOVIRAX 5 %, , Disp: , Rfl: 6  Social History   Tobacco Use  Smoking Status Never Smoker  Smokeless Tobacco Never Used    Allergies  Allergen Reactions  . Latex    Objective:   Vitals:   01/23/18 1347  BP: (!) 155/94  Pulse: 87  Resp: 16   There is no height or weight on file to calculate BMI. Constitutional Well developed. Well nourished.  Vascular Dorsalis pedis pulses palpable bilaterally. Posterior tibial pulses palpable  bilaterally. Capillary refill normal to all digits.  No cyanosis or clubbing noted. Pedal hair growth normal.  Neurologic Normal speech. Oriented to person, place, and time. Epicritic sensation to light touch grossly present bilaterally. Protective sensation absent bilaterally.  Dermatologic Nails elongated and dystrophic. No open wounds. HPK R Submet 3,5 Skin plantar forefoot bilat with thin epithelialization.  Orthopedic: Normal joint ROM without pain or crepitus bilaterally. No visible deformities. No bony tenderness.   Radiographs: None Assessment:   1. DM type 2 with diabetic peripheral neuropathy ([place])   2. Onychomycosis   3. Porokeratosis    Plan:  Patient was evaluated and treated and all questions answered.  Diabetes with DPN, Onychomycosis -Educated on diabetic footcare. Diabetic risk level 1 -Nails x10 debrided sharply and manually with large nail nipper and rotary burr.  -Callus x2 debrided  Procedure: Nail Debridement Rationale: Patient meets criteria for routine foot care due to DPN Type of Debridement: manual, sharp debridement. Instrumentation: Nail nipper, rotary burr. Number of Nails: 10  Procedure: Paring of Lesion Rationale: painful hyperkeratotic lesion Type of Debridement: manual, sharp debridement. Instrumentation: 312 blade Number of Lesions: 2    No follow-ups on file.

## 2018-01-25 DIAGNOSIS — Z794 Long term (current) use of insulin: Secondary | ICD-10-CM | POA: Diagnosis not present

## 2018-01-25 DIAGNOSIS — E1129 Type 2 diabetes mellitus with other diabetic kidney complication: Secondary | ICD-10-CM | POA: Diagnosis not present

## 2018-01-25 DIAGNOSIS — Z6836 Body mass index (BMI) 36.0-36.9, adult: Secondary | ICD-10-CM | POA: Diagnosis not present

## 2018-01-25 DIAGNOSIS — N183 Chronic kidney disease, stage 3 (moderate): Secondary | ICD-10-CM | POA: Diagnosis not present

## 2018-01-25 DIAGNOSIS — I10 Essential (primary) hypertension: Secondary | ICD-10-CM | POA: Diagnosis not present

## 2018-01-26 DIAGNOSIS — M25612 Stiffness of left shoulder, not elsewhere classified: Secondary | ICD-10-CM | POA: Diagnosis not present

## 2018-01-26 DIAGNOSIS — M6281 Muscle weakness (generalized): Secondary | ICD-10-CM | POA: Diagnosis not present

## 2018-01-26 DIAGNOSIS — M25611 Stiffness of right shoulder, not elsewhere classified: Secondary | ICD-10-CM | POA: Diagnosis not present

## 2018-01-26 DIAGNOSIS — M542 Cervicalgia: Secondary | ICD-10-CM | POA: Diagnosis not present

## 2018-01-26 DIAGNOSIS — M25511 Pain in right shoulder: Secondary | ICD-10-CM | POA: Diagnosis not present

## 2018-01-26 DIAGNOSIS — M25512 Pain in left shoulder: Secondary | ICD-10-CM | POA: Diagnosis not present

## 2018-01-30 DIAGNOSIS — M25511 Pain in right shoulder: Secondary | ICD-10-CM | POA: Diagnosis not present

## 2018-01-30 DIAGNOSIS — M25611 Stiffness of right shoulder, not elsewhere classified: Secondary | ICD-10-CM | POA: Diagnosis not present

## 2018-01-30 DIAGNOSIS — M542 Cervicalgia: Secondary | ICD-10-CM | POA: Diagnosis not present

## 2018-01-30 DIAGNOSIS — M6281 Muscle weakness (generalized): Secondary | ICD-10-CM | POA: Diagnosis not present

## 2018-01-30 DIAGNOSIS — M25512 Pain in left shoulder: Secondary | ICD-10-CM | POA: Diagnosis not present

## 2018-01-30 DIAGNOSIS — M25612 Stiffness of left shoulder, not elsewhere classified: Secondary | ICD-10-CM | POA: Diagnosis not present

## 2018-02-07 DIAGNOSIS — M25612 Stiffness of left shoulder, not elsewhere classified: Secondary | ICD-10-CM | POA: Diagnosis not present

## 2018-02-07 DIAGNOSIS — M25611 Stiffness of right shoulder, not elsewhere classified: Secondary | ICD-10-CM | POA: Diagnosis not present

## 2018-02-07 DIAGNOSIS — M25512 Pain in left shoulder: Secondary | ICD-10-CM | POA: Diagnosis not present

## 2018-02-07 DIAGNOSIS — M6281 Muscle weakness (generalized): Secondary | ICD-10-CM | POA: Diagnosis not present

## 2018-02-07 DIAGNOSIS — M2560 Stiffness of unspecified joint, not elsewhere classified: Secondary | ICD-10-CM | POA: Diagnosis not present

## 2018-02-07 DIAGNOSIS — M542 Cervicalgia: Secondary | ICD-10-CM | POA: Diagnosis not present

## 2018-02-07 DIAGNOSIS — M25511 Pain in right shoulder: Secondary | ICD-10-CM | POA: Diagnosis not present

## 2018-02-07 DIAGNOSIS — R293 Abnormal posture: Secondary | ICD-10-CM | POA: Diagnosis not present

## 2018-02-08 DIAGNOSIS — M25611 Stiffness of right shoulder, not elsewhere classified: Secondary | ICD-10-CM | POA: Diagnosis not present

## 2018-02-08 DIAGNOSIS — M6281 Muscle weakness (generalized): Secondary | ICD-10-CM | POA: Diagnosis not present

## 2018-02-08 DIAGNOSIS — M25511 Pain in right shoulder: Secondary | ICD-10-CM | POA: Diagnosis not present

## 2018-02-08 DIAGNOSIS — M542 Cervicalgia: Secondary | ICD-10-CM | POA: Diagnosis not present

## 2018-02-08 DIAGNOSIS — M25512 Pain in left shoulder: Secondary | ICD-10-CM | POA: Diagnosis not present

## 2018-02-08 DIAGNOSIS — M25612 Stiffness of left shoulder, not elsewhere classified: Secondary | ICD-10-CM | POA: Diagnosis not present

## 2018-02-12 DIAGNOSIS — M25511 Pain in right shoulder: Secondary | ICD-10-CM | POA: Diagnosis not present

## 2018-02-12 DIAGNOSIS — M25512 Pain in left shoulder: Secondary | ICD-10-CM | POA: Diagnosis not present

## 2018-02-12 DIAGNOSIS — M542 Cervicalgia: Secondary | ICD-10-CM | POA: Diagnosis not present

## 2018-02-12 DIAGNOSIS — M25612 Stiffness of left shoulder, not elsewhere classified: Secondary | ICD-10-CM | POA: Diagnosis not present

## 2018-02-12 DIAGNOSIS — M25611 Stiffness of right shoulder, not elsewhere classified: Secondary | ICD-10-CM | POA: Diagnosis not present

## 2018-02-12 DIAGNOSIS — M6281 Muscle weakness (generalized): Secondary | ICD-10-CM | POA: Diagnosis not present

## 2018-02-14 DIAGNOSIS — M6281 Muscle weakness (generalized): Secondary | ICD-10-CM | POA: Diagnosis not present

## 2018-02-14 DIAGNOSIS — M542 Cervicalgia: Secondary | ICD-10-CM | POA: Diagnosis not present

## 2018-02-14 DIAGNOSIS — M25511 Pain in right shoulder: Secondary | ICD-10-CM | POA: Diagnosis not present

## 2018-02-14 DIAGNOSIS — M25512 Pain in left shoulder: Secondary | ICD-10-CM | POA: Diagnosis not present

## 2018-02-14 DIAGNOSIS — M25611 Stiffness of right shoulder, not elsewhere classified: Secondary | ICD-10-CM | POA: Diagnosis not present

## 2018-02-14 DIAGNOSIS — M25612 Stiffness of left shoulder, not elsewhere classified: Secondary | ICD-10-CM | POA: Diagnosis not present

## 2018-02-21 DIAGNOSIS — Z803 Family history of malignant neoplasm of breast: Secondary | ICD-10-CM | POA: Diagnosis not present

## 2018-02-21 DIAGNOSIS — L720 Epidermal cyst: Secondary | ICD-10-CM | POA: Diagnosis not present

## 2018-02-21 DIAGNOSIS — Z0000 Encounter for general adult medical examination without abnormal findings: Secondary | ICD-10-CM | POA: Diagnosis not present

## 2018-02-21 DIAGNOSIS — D510 Vitamin B12 deficiency anemia due to intrinsic factor deficiency: Secondary | ICD-10-CM | POA: Diagnosis not present

## 2018-02-21 DIAGNOSIS — Z6837 Body mass index (BMI) 37.0-37.9, adult: Secondary | ICD-10-CM | POA: Diagnosis not present

## 2018-02-22 DIAGNOSIS — M25512 Pain in left shoulder: Secondary | ICD-10-CM | POA: Diagnosis not present

## 2018-02-22 DIAGNOSIS — M542 Cervicalgia: Secondary | ICD-10-CM | POA: Diagnosis not present

## 2018-02-22 DIAGNOSIS — M6281 Muscle weakness (generalized): Secondary | ICD-10-CM | POA: Diagnosis not present

## 2018-02-22 DIAGNOSIS — M25611 Stiffness of right shoulder, not elsewhere classified: Secondary | ICD-10-CM | POA: Diagnosis not present

## 2018-02-22 DIAGNOSIS — M25612 Stiffness of left shoulder, not elsewhere classified: Secondary | ICD-10-CM | POA: Diagnosis not present

## 2018-02-22 DIAGNOSIS — M25511 Pain in right shoulder: Secondary | ICD-10-CM | POA: Diagnosis not present

## 2018-03-01 DIAGNOSIS — M25612 Stiffness of left shoulder, not elsewhere classified: Secondary | ICD-10-CM | POA: Diagnosis not present

## 2018-03-01 DIAGNOSIS — M25511 Pain in right shoulder: Secondary | ICD-10-CM | POA: Diagnosis not present

## 2018-03-01 DIAGNOSIS — M6281 Muscle weakness (generalized): Secondary | ICD-10-CM | POA: Diagnosis not present

## 2018-03-01 DIAGNOSIS — M25512 Pain in left shoulder: Secondary | ICD-10-CM | POA: Diagnosis not present

## 2018-03-01 DIAGNOSIS — M542 Cervicalgia: Secondary | ICD-10-CM | POA: Diagnosis not present

## 2018-03-01 DIAGNOSIS — M25611 Stiffness of right shoulder, not elsewhere classified: Secondary | ICD-10-CM | POA: Diagnosis not present

## 2018-03-06 DIAGNOSIS — M25611 Stiffness of right shoulder, not elsewhere classified: Secondary | ICD-10-CM | POA: Diagnosis not present

## 2018-03-06 DIAGNOSIS — M6281 Muscle weakness (generalized): Secondary | ICD-10-CM | POA: Diagnosis not present

## 2018-03-06 DIAGNOSIS — M542 Cervicalgia: Secondary | ICD-10-CM | POA: Diagnosis not present

## 2018-03-06 DIAGNOSIS — M25512 Pain in left shoulder: Secondary | ICD-10-CM | POA: Diagnosis not present

## 2018-03-06 DIAGNOSIS — M25511 Pain in right shoulder: Secondary | ICD-10-CM | POA: Diagnosis not present

## 2018-03-06 DIAGNOSIS — M25612 Stiffness of left shoulder, not elsewhere classified: Secondary | ICD-10-CM | POA: Diagnosis not present

## 2018-03-12 DIAGNOSIS — Z3040 Encounter for surveillance of contraceptives, unspecified: Secondary | ICD-10-CM | POA: Diagnosis not present

## 2018-03-13 DIAGNOSIS — M542 Cervicalgia: Secondary | ICD-10-CM | POA: Diagnosis not present

## 2018-03-13 DIAGNOSIS — M25511 Pain in right shoulder: Secondary | ICD-10-CM | POA: Diagnosis not present

## 2018-03-13 DIAGNOSIS — M25611 Stiffness of right shoulder, not elsewhere classified: Secondary | ICD-10-CM | POA: Diagnosis not present

## 2018-03-13 DIAGNOSIS — M25612 Stiffness of left shoulder, not elsewhere classified: Secondary | ICD-10-CM | POA: Diagnosis not present

## 2018-03-13 DIAGNOSIS — M6281 Muscle weakness (generalized): Secondary | ICD-10-CM | POA: Diagnosis not present

## 2018-03-13 DIAGNOSIS — M2560 Stiffness of unspecified joint, not elsewhere classified: Secondary | ICD-10-CM | POA: Diagnosis not present

## 2018-03-13 DIAGNOSIS — R293 Abnormal posture: Secondary | ICD-10-CM | POA: Diagnosis not present

## 2018-03-13 DIAGNOSIS — M25512 Pain in left shoulder: Secondary | ICD-10-CM | POA: Diagnosis not present

## 2018-03-14 DIAGNOSIS — R922 Inconclusive mammogram: Secondary | ICD-10-CM | POA: Diagnosis not present

## 2018-03-14 DIAGNOSIS — R928 Other abnormal and inconclusive findings on diagnostic imaging of breast: Secondary | ICD-10-CM | POA: Diagnosis not present

## 2018-03-14 DIAGNOSIS — N6081 Other benign mammary dysplasias of right breast: Secondary | ICD-10-CM | POA: Diagnosis not present

## 2018-03-14 DIAGNOSIS — N6002 Solitary cyst of left breast: Secondary | ICD-10-CM | POA: Diagnosis not present

## 2018-03-16 DIAGNOSIS — M25512 Pain in left shoulder: Secondary | ICD-10-CM | POA: Diagnosis not present

## 2018-03-16 DIAGNOSIS — M25612 Stiffness of left shoulder, not elsewhere classified: Secondary | ICD-10-CM | POA: Diagnosis not present

## 2018-03-16 DIAGNOSIS — M25611 Stiffness of right shoulder, not elsewhere classified: Secondary | ICD-10-CM | POA: Diagnosis not present

## 2018-03-16 DIAGNOSIS — M25511 Pain in right shoulder: Secondary | ICD-10-CM | POA: Diagnosis not present

## 2018-03-16 DIAGNOSIS — M542 Cervicalgia: Secondary | ICD-10-CM | POA: Diagnosis not present

## 2018-03-16 DIAGNOSIS — M6281 Muscle weakness (generalized): Secondary | ICD-10-CM | POA: Diagnosis not present

## 2018-03-27 ENCOUNTER — Ambulatory Visit (INDEPENDENT_AMBULATORY_CARE_PROVIDER_SITE_OTHER): Payer: Medicare Other | Admitting: *Deleted

## 2018-03-27 DIAGNOSIS — Q828 Other specified congenital malformations of skin: Secondary | ICD-10-CM | POA: Diagnosis not present

## 2018-03-27 DIAGNOSIS — M2042 Other hammer toe(s) (acquired), left foot: Secondary | ICD-10-CM

## 2018-03-27 DIAGNOSIS — M2040 Other hammer toe(s) (acquired), unspecified foot: Secondary | ICD-10-CM

## 2018-03-27 DIAGNOSIS — E1142 Type 2 diabetes mellitus with diabetic polyneuropathy: Secondary | ICD-10-CM

## 2018-03-27 DIAGNOSIS — M2041 Other hammer toe(s) (acquired), right foot: Secondary | ICD-10-CM | POA: Diagnosis not present

## 2018-03-27 NOTE — Patient Instructions (Signed)

## 2018-03-28 DIAGNOSIS — Z538 Procedure and treatment not carried out for other reasons: Secondary | ICD-10-CM | POA: Diagnosis not present

## 2018-03-28 DIAGNOSIS — Z793 Long term (current) use of hormonal contraceptives: Secondary | ICD-10-CM | POA: Diagnosis not present

## 2018-04-05 NOTE — Progress Notes (Signed)
Patient ID: Sara Chung, female   DOB: [birth year], 43 y.o.   MRN: [id number]   Patient presents for diabetic shoe pick up, shoes are tried on for good fit.  Patient received 1 pairApex Men - Bolt Athletic Knit - FitLite Collection BLACK [id number] in 8.5 wide and 3 pairs custom molded diabetic inserts.  Verbal and written break in and wear instructions given.  Patient will follow up for scheduled routine care.

## 2018-04-23 ENCOUNTER — Ambulatory Visit: Payer: Medicare Other | Admitting: Podiatry

## 2018-05-07 ENCOUNTER — Encounter: Payer: Self-pay | Admitting: Podiatry

## 2018-05-07 ENCOUNTER — Other Ambulatory Visit: Payer: Self-pay

## 2018-05-07 ENCOUNTER — Ambulatory Visit (INDEPENDENT_AMBULATORY_CARE_PROVIDER_SITE_OTHER): Payer: Medicare Other | Admitting: Podiatry

## 2018-05-07 VITALS — BP 124/83 | HR 104 | Resp 16

## 2018-05-07 DIAGNOSIS — R251 Tremor, unspecified: Secondary | ICD-10-CM | POA: Diagnosis not present

## 2018-05-07 DIAGNOSIS — E538 Deficiency of other specified B group vitamins: Secondary | ICD-10-CM | POA: Diagnosis not present

## 2018-05-07 DIAGNOSIS — E7849 Other hyperlipidemia: Secondary | ICD-10-CM | POA: Diagnosis not present

## 2018-05-07 DIAGNOSIS — E1129 Type 2 diabetes mellitus with other diabetic kidney complication: Secondary | ICD-10-CM | POA: Diagnosis not present

## 2018-05-07 DIAGNOSIS — Q828 Other specified congenital malformations of skin: Secondary | ICD-10-CM

## 2018-05-07 DIAGNOSIS — Z794 Long term (current) use of insulin: Secondary | ICD-10-CM | POA: Diagnosis not present

## 2018-05-07 DIAGNOSIS — I10 Essential (primary) hypertension: Secondary | ICD-10-CM | POA: Diagnosis not present

## 2018-05-07 DIAGNOSIS — B351 Tinea unguium: Secondary | ICD-10-CM

## 2018-05-07 DIAGNOSIS — E1142 Type 2 diabetes mellitus with diabetic polyneuropathy: Secondary | ICD-10-CM

## 2018-05-07 DIAGNOSIS — N183 Chronic kidney disease, stage 3 (moderate): Secondary | ICD-10-CM | POA: Diagnosis not present

## 2018-05-07 DIAGNOSIS — F418 Other specified anxiety disorders: Secondary | ICD-10-CM | POA: Diagnosis not present

## 2018-05-07 DIAGNOSIS — F79 Unspecified intellectual disabilities: Secondary | ICD-10-CM | POA: Diagnosis not present

## 2018-05-07 DIAGNOSIS — Z6835 Body mass index (BMI) 35.0-35.9, adult: Secondary | ICD-10-CM | POA: Diagnosis not present

## 2018-05-07 DIAGNOSIS — G43009 Migraine without aura, not intractable, without status migrainosus: Secondary | ICD-10-CM | POA: Diagnosis not present

## 2018-05-07 NOTE — Progress Notes (Signed)
Subjective:  Patient ID: Sara Chung, female    DOB: 1975-11-17,  MRN: [id number]  Chief Complaint  Patient presents with  . debride    BL nail trimming -FBS: 130 A1C: 8 CPP: [place] x today     43 y.o. female presents with the above complaint. States her DM shoes are a little uncomfortable. Having pain at the callus on her right foot.  Review of Systems: Negative except as noted in the HPI. Denies N/V/F/Ch.  Past Medical History:  Diagnosis Date  . Diabetes mellitus without complication ([place])   . Scoliosis     Current Outpatient Medications:  .  BD PEN NEEDLE NANO U/F 32G X 4 MM MISC, U QID UTD, Disp: , Rfl:  .  Canagliflozin (INVOKANA) 300 MG TABS, Take by mouth., Disp: , Rfl:  .  carisoprodol (SOMA) 350 MG tablet, , Disp: , Rfl: 2 .  CRESTOR 10 MG tablet, , Disp: , Rfl: 6 .  escitalopram (LEXAPRO) 10 MG tablet, TK 1 T PO QD, Disp: , Rfl:  .  estradiol cypionate (DEPO-ESTRADIOL) 5 MG/ML injection, Inject into the muscle every 28 (twenty-eight) days., Disp: , Rfl:  .  glimepiride (AMARYL) 2 MG tablet, , Disp: , Rfl: 6 .  glipiZIDE (GLUCOTROL) 10 MG tablet, Take 10 mg by mouth daily before breakfast., Disp: , Rfl:  .  insulin glargine (LANTUS) 100 UNIT/ML injection, Inject into the skin at bedtime., Disp: , Rfl:  .  LANTUS SOLOSTAR 100 UNIT/ML Solostar Pen, , Disp: , Rfl: 5 .  metFORMIN (GLUCOPHAGE) 500 MG tablet, Take by mouth 2 (two) times daily with a meal., Disp: , Rfl:  .  NOVOLOG FLEXPEN 100 UNIT/ML FlexPen, INJ 8 UNITS  WITH EACH MEAL, Disp: , Rfl:  .  pioglitazone-metformin (ACTOPLUS MET) 15-850 MG per tablet, , Disp: , Rfl: 5 .  topiramate (TOPAMAX) 25 MG tablet, , Disp: , Rfl: 5 .  valACYclovir (VALTREX) 1000 MG tablet, , Disp: , Rfl: 0 .  ZOVIRAX 5 %, , Disp: , Rfl: 6  Social History   Tobacco Use  Smoking Status Never Smoker  Smokeless Tobacco Never Used    Allergies  Allergen Reactions  . Latex    Objective:   Vitals:   05/07/18 1321  BP: 124/83   Pulse: (!) 104  Resp: 16   There is no height or weight on file to calculate BMI. Constitutional Well developed. Well nourished.  Vascular Dorsalis pedis pulses palpable bilaterally. Posterior tibial pulses palpable bilaterally. Capillary refill normal to all digits.  No cyanosis or clubbing noted. Pedal hair growth normal.  Neurologic Normal speech. Oriented to person, place, and time. Epicritic sensation to light touch grossly present bilaterally. Protective sensation absent bilaterally.  Dermatologic Nails elongated and dystrophic. No open wounds. HPK R Submet 5  Orthopedic: Normal joint ROM without pain or crepitus bilaterally. No visible deformities. No bony tenderness.   Radiographs: None Assessment:   1. DM type 2 with diabetic peripheral neuropathy ([place])   2. Porokeratosis   3. Onychomycosis    Plan:  Patient was evaluated and treated and all questions answered.  Diabetes with DPN, Onychomycosis -Routine foot care as below -Adjusted DM insert to offload the right 5th metatarsal head. Loosened shoes as she states she is getting dorsal irritation. Normally wears women's sz 8.5 states that she got a 10.5, discussed that if anything this would be larger but I think the sizing is suitable. If pain persists will have her f/u with orthotist for eval  of shoes.  Procedure: Nail Debridement Rationale: Patient meets criteria for routine foot care due to DPN Type of Debridement: manual, sharp debridement. Instrumentation: Nail nipper, rotary burr. Number of Nails: 10   Procedure: Paring of Lesion Rationale: painful hyperkeratotic lesion Type of Debridement: manual, sharp debridement. Instrumentation: 312 blade Number of Lesions: 1    No follow-ups on file.

## 2018-06-26 DIAGNOSIS — N183 Chronic kidney disease, stage 3 (moderate): Secondary | ICD-10-CM | POA: Diagnosis not present

## 2018-06-26 DIAGNOSIS — E1129 Type 2 diabetes mellitus with other diabetic kidney complication: Secondary | ICD-10-CM | POA: Diagnosis not present

## 2018-06-26 DIAGNOSIS — Z794 Long term (current) use of insulin: Secondary | ICD-10-CM | POA: Diagnosis not present

## 2018-06-26 DIAGNOSIS — I10 Essential (primary) hypertension: Secondary | ICD-10-CM | POA: Diagnosis not present

## 2018-07-09 ENCOUNTER — Ambulatory Visit (INDEPENDENT_AMBULATORY_CARE_PROVIDER_SITE_OTHER): Payer: Medicare Other | Admitting: Podiatry

## 2018-07-09 ENCOUNTER — Other Ambulatory Visit: Payer: Self-pay

## 2018-07-09 DIAGNOSIS — E1142 Type 2 diabetes mellitus with diabetic polyneuropathy: Secondary | ICD-10-CM | POA: Diagnosis not present

## 2018-07-09 DIAGNOSIS — E1169 Type 2 diabetes mellitus with other specified complication: Secondary | ICD-10-CM | POA: Diagnosis not present

## 2018-07-09 DIAGNOSIS — B351 Tinea unguium: Secondary | ICD-10-CM | POA: Diagnosis not present

## 2018-07-09 DIAGNOSIS — Q828 Other specified congenital malformations of skin: Secondary | ICD-10-CM

## 2018-07-10 ENCOUNTER — Telehealth: Payer: Self-pay | Admitting: Podiatry

## 2018-07-10 NOTE — Telephone Encounter (Signed)
Left voicemail with patient's mother Lelon Frohlich to schedule 9 wk nail trim with Dr. March Rummage in [place].

## 2018-08-04 NOTE — Progress Notes (Signed)
Subjective:  Patient ID: Sara Chung, female    DOB: [birth year],  MRN: [id number]  No chief complaint on file.   43 y.o. female presents with the above complaint.  Last fasting blood sugar 93.  Last A1c of 8.0.  Denies new pedal issues.  States that the orthotics that were adjusted last time are feeling much better.  Review of Systems: Negative except as noted in the HPI. Denies N/V/F/Ch.  Past Medical History:  Diagnosis Date  . Diabetes mellitus without complication ([place])   . Scoliosis     Current Outpatient Medications:  .  BD PEN NEEDLE NANO U/F 32G X 4 MM MISC, U QID UTD, Disp: , Rfl:  .  Canagliflozin (INVOKANA) 300 MG TABS, Take by mouth., Disp: , Rfl:  .  carisoprodol (SOMA) 350 MG tablet, , Disp: , Rfl: 2 .  CRESTOR 10 MG tablet, , Disp: , Rfl: 6 .  escitalopram (LEXAPRO) 10 MG tablet, TK 1 T PO QD, Disp: , Rfl:  .  estradiol cypionate (DEPO-ESTRADIOL) 5 MG/ML injection, Inject into the muscle every 28 (twenty-eight) days., Disp: , Rfl:  .  glimepiride (AMARYL) 2 MG tablet, , Disp: , Rfl: 6 .  glipiZIDE (GLUCOTROL) 10 MG tablet, Take 10 mg by mouth daily before breakfast., Disp: , Rfl:  .  insulin glargine (LANTUS) 100 UNIT/ML injection, Inject into the skin at bedtime., Disp: , Rfl:  .  LANTUS SOLOSTAR 100 UNIT/ML Solostar Pen, , Disp: , Rfl: 5 .  metFORMIN (GLUCOPHAGE) 500 MG tablet, Take by mouth 2 (two) times daily with a meal., Disp: , Rfl:  .  montelukast (SINGULAIR) 10 MG tablet, TK 1 T PO QD, Disp: , Rfl:  .  NOVOLOG FLEXPEN 100 UNIT/ML FlexPen, INJ 8 UNITS  WITH EACH MEAL, Disp: , Rfl:  .  pioglitazone-metformin (ACTOPLUS MET) 15-850 MG per tablet, , Disp: , Rfl: 5 .  topiramate (TOPAMAX) 25 MG tablet, , Disp: , Rfl: 5 .  valACYclovir (VALTREX) 1000 MG tablet, , Disp: , Rfl: 0 .  ZOVIRAX 5 %, , Disp: , Rfl: 6  Social History   Tobacco Use  Smoking Status Never Smoker  Smokeless Tobacco Never Used    Allergies  Allergen Reactions  . Latex     Objective:   There were no vitals filed for this visit. There is no height or weight on file to calculate BMI. Constitutional Well developed. Well nourished.  Vascular Dorsalis pedis pulses palpable bilaterally. Posterior tibial pulses palpable bilaterally. Capillary refill normal to all digits.  No cyanosis or clubbing noted. Pedal hair growth normal.  Neurologic Normal speech. Oriented to person, place, and time. Epicritic sensation to light touch grossly present bilaterally. Protective sensation absent bilaterally.  Dermatologic Nails elongated and dystrophic. No open wounds. HPK R Submet 5  Orthopedic: Normal joint ROM without pain or crepitus bilaterally. No visible deformities. No bony tenderness.   Radiographs: None Assessment:   1. Onychomycosis of multiple toenails with type 2 diabetes mellitus and peripheral neuropathy ([place])   2. Porokeratosis   3. DM type 2 with diabetic peripheral neuropathy ([place])    Plan:  Patient was evaluated and treated and all questions answered.  Diabetes with DPN, Onychomycosis -Routine foot care as below  Procedure: Nail Debridement Rationale: Patient meets criteria for routine foot care due to DPN Type of Debridement: manual, sharp debridement. Instrumentation: Nail nipper, rotary burr. Number of Nails: 10   Procedure: Paring of Lesion Rationale: painful hyperkeratotic lesion Type of Debridement: manual, sharp  debridement. Instrumentation: 312 blade Number of Lesions: 1    No follow-ups on file.

## 2018-09-05 DIAGNOSIS — G43009 Migraine without aura, not intractable, without status migrainosus: Secondary | ICD-10-CM | POA: Diagnosis not present

## 2018-09-05 DIAGNOSIS — E785 Hyperlipidemia, unspecified: Secondary | ICD-10-CM | POA: Diagnosis not present

## 2018-09-05 DIAGNOSIS — M25572 Pain in left ankle and joints of left foot: Secondary | ICD-10-CM | POA: Diagnosis not present

## 2018-09-05 DIAGNOSIS — F79 Unspecified intellectual disabilities: Secondary | ICD-10-CM | POA: Diagnosis not present

## 2018-09-05 DIAGNOSIS — E538 Deficiency of other specified B group vitamins: Secondary | ICD-10-CM | POA: Diagnosis not present

## 2018-09-05 DIAGNOSIS — N183 Chronic kidney disease, stage 3 (moderate): Secondary | ICD-10-CM | POA: Diagnosis not present

## 2018-09-05 DIAGNOSIS — E1129 Type 2 diabetes mellitus with other diabetic kidney complication: Secondary | ICD-10-CM | POA: Diagnosis not present

## 2018-09-05 DIAGNOSIS — F418 Other specified anxiety disorders: Secondary | ICD-10-CM | POA: Diagnosis not present

## 2018-09-17 ENCOUNTER — Other Ambulatory Visit: Payer: Self-pay

## 2018-09-17 ENCOUNTER — Ambulatory Visit (INDEPENDENT_AMBULATORY_CARE_PROVIDER_SITE_OTHER): Payer: Medicare Other | Admitting: Podiatry

## 2018-09-17 ENCOUNTER — Encounter: Payer: Self-pay | Admitting: Podiatry

## 2018-09-17 VITALS — Temp 97.5°F | Resp 16

## 2018-09-17 DIAGNOSIS — E1142 Type 2 diabetes mellitus with diabetic polyneuropathy: Secondary | ICD-10-CM | POA: Diagnosis not present

## 2018-09-17 DIAGNOSIS — Q828 Other specified congenital malformations of skin: Secondary | ICD-10-CM

## 2018-09-17 DIAGNOSIS — B351 Tinea unguium: Secondary | ICD-10-CM

## 2018-09-17 DIAGNOSIS — E1169 Type 2 diabetes mellitus with other specified complication: Secondary | ICD-10-CM

## 2018-09-17 NOTE — Progress Notes (Signed)
Subjective:  Patient ID: Sara Chung, female    DOB: Feb 27, 1976,  MRN: [id number]  Chief Complaint  Patient presents with  . debride    Diabetic nail triming  . foot lesion    Pt c/o Rt sub met 5 lesion x 1 wk; no pain just very tender -pt denies redness/swelling/drainage Tx: none    43 y.o. female presents with the above complaint.  States her last fasting blood sugar was good with last A1c of 8.  Complains of callus underneath the ball of the right foot.  States it is tender denies swelling warmth or redness  Review of Systems: Negative except as noted in the HPI. Denies N/V/F/Ch.  Past Medical History:  Diagnosis Date  . Diabetes mellitus without complication ([place])   . Scoliosis     Current Outpatient Medications:  .  BD PEN NEEDLE NANO U/F 32G X 4 MM MISC, U QID UTD, Disp: , Rfl:  .  Canagliflozin (INVOKANA) 300 MG TABS, Take by mouth., Disp: , Rfl:  .  carisoprodol (SOMA) 350 MG tablet, , Disp: , Rfl: 2 .  CRESTOR 10 MG tablet, , Disp: , Rfl: 6 .  escitalopram (LEXAPRO) 10 MG tablet, TK 1 T PO QD, Disp: , Rfl:  .  estradiol cypionate (DEPO-ESTRADIOL) 5 MG/ML injection, Inject into the muscle every 28 (twenty-eight) days., Disp: , Rfl:  .  glimepiride (AMARYL) 2 MG tablet, , Disp: , Rfl: 6 .  glipiZIDE (GLUCOTROL) 10 MG tablet, Take 10 mg by mouth daily before breakfast., Disp: , Rfl:  .  insulin glargine (LANTUS) 100 UNIT/ML injection, Inject into the skin at bedtime., Disp: , Rfl:  .  LANTUS SOLOSTAR 100 UNIT/ML Solostar Pen, , Disp: , Rfl: 5 .  metFORMIN (GLUCOPHAGE) 500 MG tablet, Take by mouth 2 (two) times daily with a meal., Disp: , Rfl:  .  montelukast (SINGULAIR) 10 MG tablet, TK 1 T PO QD, Disp: , Rfl:  .  NOVOLOG FLEXPEN 100 UNIT/ML FlexPen, INJ 8 UNITS [place] WITH EACH MEAL, Disp: , Rfl:  .  pioglitazone-metformin (ACTOPLUS MET) 15-850 MG per tablet, , Disp: , Rfl: 5 .  topiramate (TOPAMAX) 25 MG tablet, , Disp: , Rfl: 5 .  valACYclovir (VALTREX) 1000 MG  tablet, , Disp: , Rfl: 0 .  ZOVIRAX 5 %, , Disp: , Rfl: 6  Social History   Tobacco Use  Smoking Status Never Smoker  Smokeless Tobacco Never Used    Allergies  Allergen Reactions  . Latex    Objective:   Vitals:   09/17/18 1430  Resp: 16  Temp: (!) 97.5 F (36.4 C)   There is no height or weight on file to calculate BMI. Constitutional Well developed. Well nourished.  Vascular Dorsalis pedis pulses palpable bilaterally. Posterior tibial pulses palpable bilaterally. Capillary refill normal to all digits.  No cyanosis or clubbing noted. Pedal hair growth normal.  Neurologic Normal speech. Oriented to person, place, and time. Epicritic sensation to light touch grossly present bilaterally. Protective sensation absent bilaterally.  Dermatologic Nails elongated and dystrophic. No open wounds. HPK R Submet 5  Orthopedic: Normal joint ROM without pain or crepitus bilaterally. No visible deformities. No bony tenderness.   Radiographs: None Assessment:   1. Onychomycosis of multiple toenails with type 2 diabetes mellitus and peripheral neuropathy ([place])   2. Porokeratosis    Plan:  Patient was evaluated and treated and all questions answered.  Diabetes with DPN, Onychomycosis -Routine foot care as below  Procedure: Nail Debridement  Rationale: Patient meets criteria for routine foot care due to DPN Type of Debridement: manual, sharp debridement. Instrumentation: Nail nipper, rotary burr. Number of Nails: 10  Procedure: Paring of Lesion Rationale: painful hyperkeratotic lesion Type of Debridement: manual, sharp debridement. Instrumentation: 312 blade Number of Lesions: 1    Return in about 9 weeks (around 11/19/2018) for Diabetic Foot Care.

## 2018-11-01 DIAGNOSIS — I129 Hypertensive chronic kidney disease with stage 1 through stage 4 chronic kidney disease, or unspecified chronic kidney disease: Secondary | ICD-10-CM | POA: Insufficient documentation

## 2018-11-01 DIAGNOSIS — N183 Chronic kidney disease, stage 3 (moderate): Secondary | ICD-10-CM | POA: Diagnosis not present

## 2018-11-01 DIAGNOSIS — Z794 Long term (current) use of insulin: Secondary | ICD-10-CM | POA: Diagnosis not present

## 2018-11-01 DIAGNOSIS — Z23 Encounter for immunization: Secondary | ICD-10-CM | POA: Diagnosis not present

## 2018-11-01 DIAGNOSIS — I10 Essential (primary) hypertension: Secondary | ICD-10-CM | POA: Diagnosis not present

## 2018-11-01 DIAGNOSIS — E1129 Type 2 diabetes mellitus with other diabetic kidney complication: Secondary | ICD-10-CM | POA: Diagnosis not present

## 2018-12-18 ENCOUNTER — Other Ambulatory Visit: Payer: Self-pay

## 2018-12-18 ENCOUNTER — Ambulatory Visit (INDEPENDENT_AMBULATORY_CARE_PROVIDER_SITE_OTHER): Payer: Medicare Other | Admitting: Podiatry

## 2018-12-18 DIAGNOSIS — Q828 Other specified congenital malformations of skin: Secondary | ICD-10-CM | POA: Diagnosis not present

## 2018-12-18 DIAGNOSIS — E1142 Type 2 diabetes mellitus with diabetic polyneuropathy: Secondary | ICD-10-CM

## 2018-12-18 DIAGNOSIS — E1169 Type 2 diabetes mellitus with other specified complication: Secondary | ICD-10-CM | POA: Diagnosis not present

## 2018-12-18 DIAGNOSIS — B351 Tinea unguium: Secondary | ICD-10-CM | POA: Diagnosis not present

## 2019-01-05 NOTE — Progress Notes (Signed)
Subjective:  Patient ID: Sara Chung, female    DOB: 09/26/1975,  MRN: [id number]  Chief Complaint  Patient presents with  . debride    Diabetic nail trimming  . Diabetes    FBS: 75 A1C: 7 PCP: [place] x 5 mo    43 y.o. female presents with the above complaint.  Denies new complaints or concerns  Review of Systems: Negative except as noted in the HPI. Denies N/V/F/Ch.  Past Medical History:  Diagnosis Date  . Diabetes mellitus without complication ([place])   . Scoliosis     Current Outpatient Medications:  .  BD PEN NEEDLE NANO U/F 32G X 4 MM MISC, U QID UTD, Disp: , Rfl:  .  Canagliflozin (INVOKANA) 300 MG TABS, Take by mouth., Disp: , Rfl:  .  carisoprodol (SOMA) 350 MG tablet, , Disp: , Rfl: 2 .  CRESTOR 10 MG tablet, , Disp: , Rfl: 6 .  escitalopram (LEXAPRO) 10 MG tablet, TK 1 T PO QD, Disp: , Rfl:  .  estradiol cypionate (DEPO-ESTRADIOL) 5 MG/ML injection, Inject into the muscle every 28 (twenty-eight) days., Disp: , Rfl:  .  glimepiride (AMARYL) 2 MG tablet, , Disp: , Rfl: 6 .  glipiZIDE (GLUCOTROL) 10 MG tablet, Take 10 mg by mouth daily before breakfast., Disp: , Rfl:  .  insulin glargine (LANTUS) 100 UNIT/ML injection, Inject into the skin at bedtime., Disp: , Rfl:  .  LANTUS SOLOSTAR 100 UNIT/ML Solostar Pen, , Disp: , Rfl: 5 .  metFORMIN (GLUCOPHAGE) 500 MG tablet, Take by mouth 2 (two) times daily with a meal., Disp: , Rfl:  .  montelukast (SINGULAIR) 10 MG tablet, TK 1 T PO QD, Disp: , Rfl:  .  NOVOLOG FLEXPEN 100 UNIT/ML FlexPen, INJ 8 UNITS [place] WITH EACH MEAL, Disp: , Rfl:  .  pioglitazone-metformin (ACTOPLUS MET) 15-850 MG per tablet, , Disp: , Rfl: 5 .  topiramate (TOPAMAX) 25 MG tablet, , Disp: , Rfl: 5 .  valACYclovir (VALTREX) 1000 MG tablet, , Disp: , Rfl: 0 .  ZOVIRAX 5 %, , Disp: , Rfl: 6  Social History   Tobacco Use  Smoking Status Never Smoker  Smokeless Tobacco Never Used    Allergies  Allergen Reactions  . Latex    Objective:    There were no vitals filed for this visit. There is no height or weight on file to calculate BMI. Constitutional Well developed. Well nourished.  Vascular Dorsalis pedis pulses palpable bilaterally. Posterior tibial pulses palpable bilaterally. Capillary refill normal to all digits.  No cyanosis or clubbing noted. Pedal hair growth normal.  Neurologic Normal speech. Oriented to person, place, and time. Epicritic sensation to light touch grossly present bilaterally. Protective sensation absent bilaterally.  Dermatologic Nails elongated and dystrophic. No open wounds. HPK R Submet 5  Orthopedic: Normal joint ROM without pain or crepitus bilaterally. No visible deformities. No bony tenderness.   Radiographs: None Assessment:   1. Onychomycosis of multiple toenails with type 2 diabetes mellitus and peripheral neuropathy ([place])   2. Porokeratosis    Plan:  Patient was evaluated and treated and all questions answered.  Diabetes with DPN, Onychomycosis -Routine foot care as below  Procedure: Nail Debridement Rationale: Patient meets criteria for routine foot care due to DPN  Type of Debridement: manual, sharp debridement. Instrumentation: Nail nipper, rotary burr. Number of Nails: 10  Procedure: Paring of Lesion Rationale: painful hyperkeratotic lesion Type of Debridement: manual, sharp debridement. Instrumentation: 312 blade Number of Lesions: 1  Return in about 3 months (around 03/20/2019).

## 2019-02-05 DIAGNOSIS — I129 Hypertensive chronic kidney disease with stage 1 through stage 4 chronic kidney disease, or unspecified chronic kidney disease: Secondary | ICD-10-CM | POA: Diagnosis not present

## 2019-02-05 DIAGNOSIS — N1832 Chronic kidney disease, stage 3b: Secondary | ICD-10-CM | POA: Diagnosis not present

## 2019-02-05 DIAGNOSIS — Z794 Long term (current) use of insulin: Secondary | ICD-10-CM | POA: Diagnosis not present

## 2019-02-05 DIAGNOSIS — E1129 Type 2 diabetes mellitus with other diabetic kidney complication: Secondary | ICD-10-CM | POA: Diagnosis not present

## 2019-02-07 DIAGNOSIS — N200 Calculus of kidney: Secondary | ICD-10-CM | POA: Diagnosis not present

## 2019-02-07 DIAGNOSIS — E1129 Type 2 diabetes mellitus with other diabetic kidney complication: Secondary | ICD-10-CM | POA: Diagnosis not present

## 2019-02-15 DIAGNOSIS — N1831 Chronic kidney disease, stage 3a: Secondary | ICD-10-CM | POA: Insufficient documentation

## 2019-02-15 DIAGNOSIS — E669 Obesity, unspecified: Secondary | ICD-10-CM | POA: Insufficient documentation

## 2019-03-25 ENCOUNTER — Ambulatory Visit: Payer: Medicare Other | Admitting: Podiatry

## 2019-05-08 DIAGNOSIS — E1129 Type 2 diabetes mellitus with other diabetic kidney complication: Secondary | ICD-10-CM | POA: Diagnosis not present

## 2019-05-08 DIAGNOSIS — I129 Hypertensive chronic kidney disease with stage 1 through stage 4 chronic kidney disease, or unspecified chronic kidney disease: Secondary | ICD-10-CM | POA: Diagnosis not present

## 2019-05-08 DIAGNOSIS — Z794 Long term (current) use of insulin: Secondary | ICD-10-CM | POA: Diagnosis not present

## 2019-05-08 DIAGNOSIS — N1831 Chronic kidney disease, stage 3a: Secondary | ICD-10-CM | POA: Diagnosis not present

## 2019-05-28 DIAGNOSIS — F79 Unspecified intellectual disabilities: Secondary | ICD-10-CM | POA: Insufficient documentation

## 2019-05-28 DIAGNOSIS — Z01419 Encounter for gynecological examination (general) (routine) without abnormal findings: Secondary | ICD-10-CM | POA: Diagnosis not present

## 2019-05-29 DIAGNOSIS — Z3202 Encounter for pregnancy test, result negative: Secondary | ICD-10-CM | POA: Diagnosis not present

## 2019-05-29 DIAGNOSIS — Z3042 Encounter for surveillance of injectable contraceptive: Secondary | ICD-10-CM | POA: Diagnosis not present

## 2019-06-14 DIAGNOSIS — Z794 Long term (current) use of insulin: Secondary | ICD-10-CM | POA: Diagnosis not present

## 2019-06-14 DIAGNOSIS — E669 Obesity, unspecified: Secondary | ICD-10-CM | POA: Diagnosis not present

## 2019-06-14 DIAGNOSIS — F418 Other specified anxiety disorders: Secondary | ICD-10-CM | POA: Diagnosis not present

## 2019-06-14 DIAGNOSIS — F79 Unspecified intellectual disabilities: Secondary | ICD-10-CM | POA: Diagnosis not present

## 2019-06-14 DIAGNOSIS — E1129 Type 2 diabetes mellitus with other diabetic kidney complication: Secondary | ICD-10-CM | POA: Diagnosis not present

## 2019-06-14 DIAGNOSIS — R251 Tremor, unspecified: Secondary | ICD-10-CM | POA: Diagnosis not present

## 2019-06-14 DIAGNOSIS — I10 Essential (primary) hypertension: Secondary | ICD-10-CM | POA: Diagnosis not present

## 2019-06-14 DIAGNOSIS — N1831 Chronic kidney disease, stage 3a: Secondary | ICD-10-CM | POA: Diagnosis not present

## 2019-06-14 DIAGNOSIS — E7849 Other hyperlipidemia: Secondary | ICD-10-CM | POA: Diagnosis not present

## 2019-06-14 DIAGNOSIS — N200 Calculus of kidney: Secondary | ICD-10-CM | POA: Diagnosis not present

## 2019-06-14 DIAGNOSIS — E538 Deficiency of other specified B group vitamins: Secondary | ICD-10-CM | POA: Diagnosis not present

## 2019-06-25 ENCOUNTER — Other Ambulatory Visit: Payer: Self-pay

## 2019-06-25 ENCOUNTER — Ambulatory Visit (INDEPENDENT_AMBULATORY_CARE_PROVIDER_SITE_OTHER): Payer: Medicare Other | Admitting: Podiatry

## 2019-06-25 DIAGNOSIS — E1169 Type 2 diabetes mellitus with other specified complication: Secondary | ICD-10-CM

## 2019-06-25 DIAGNOSIS — M2041 Other hammer toe(s) (acquired), right foot: Secondary | ICD-10-CM | POA: Diagnosis not present

## 2019-06-25 DIAGNOSIS — B351 Tinea unguium: Secondary | ICD-10-CM | POA: Diagnosis not present

## 2019-06-25 DIAGNOSIS — M2042 Other hammer toe(s) (acquired), left foot: Secondary | ICD-10-CM | POA: Diagnosis not present

## 2019-06-25 DIAGNOSIS — L84 Corns and callosities: Secondary | ICD-10-CM

## 2019-06-25 DIAGNOSIS — E1142 Type 2 diabetes mellitus with diabetic polyneuropathy: Secondary | ICD-10-CM | POA: Diagnosis not present

## 2019-06-25 NOTE — Progress Notes (Signed)
  Subjective:  Patient ID: Sara Chung, female    DOB: [birth year],  MRN: [id number]  Chief Complaint  Patient presents with  . debride    diabetic nail trimming  . Diabetes    FBS: 87 A1C: 7.5 PCP: [place] x 6-7 mo   44 y.o. female presents with the above complaint. History confirmed with patient.   Objective:  Physical Exam: warm, good capillary refill, nail exam onychomycosis of the toenails, no trophic changes or ulcerative lesions. DP pulses palpable, PT pulses palpable and protective sensation absent Hammertoes bilat Pes planus bilat HPK submet 5 right  No images are attached to the encounter.  Assessment:   1. Onychomycosis of multiple toenails with type 2 diabetes mellitus and peripheral neuropathy ([place])   2. Hammer toes of both feet   3. Callus of foot    Plan:  Patient was evaluated and treated and all questions answered.  Onychomycosis, Diabetes and DPN -Patient is diabetic with a qualifying condition for at risk foot care. -Educated on DM Footcare.  Procedure: Nail Debridement Rationale: Patient meets criteria for routine foot care due to DPN Type of Debridement: manual, sharp debridement. Instrumentation: Nail nipper, rotary burr. Number of Nails: 10  Procedure: Paring of Lesion Rationale: painful hyperkeratotic lesion Type of Debridement: manual, sharp debridement. Instrumentation: 312 blade Number of Lesions: 1  DM with HT formation, Callus -Would benefit from DM shoes. Will make appt.  Return in about 3 months (around 09/24/2019) for Diabetic Foot Care.

## 2019-07-17 ENCOUNTER — Ambulatory Visit: Payer: Medicare Other | Admitting: Orthotics

## 2019-07-17 ENCOUNTER — Other Ambulatory Visit: Payer: Self-pay

## 2019-07-17 DIAGNOSIS — L84 Corns and callosities: Secondary | ICD-10-CM

## 2019-07-17 DIAGNOSIS — E1142 Type 2 diabetes mellitus with diabetic polyneuropathy: Secondary | ICD-10-CM

## 2019-07-17 DIAGNOSIS — M2041 Other hammer toe(s) (acquired), right foot: Secondary | ICD-10-CM

## 2019-07-17 NOTE — Progress Notes (Signed)

## 2019-07-30 DIAGNOSIS — Z1231 Encounter for screening mammogram for malignant neoplasm of breast: Secondary | ICD-10-CM | POA: Diagnosis not present

## 2019-08-16 DIAGNOSIS — R922 Inconclusive mammogram: Secondary | ICD-10-CM | POA: Diagnosis not present

## 2019-08-16 DIAGNOSIS — N6489 Other specified disorders of breast: Secondary | ICD-10-CM | POA: Diagnosis not present

## 2019-08-16 DIAGNOSIS — R928 Other abnormal and inconclusive findings on diagnostic imaging of breast: Secondary | ICD-10-CM | POA: Diagnosis not present

## 2019-08-19 DIAGNOSIS — Z3042 Encounter for surveillance of injectable contraceptive: Secondary | ICD-10-CM | POA: Diagnosis not present

## 2019-08-30 ENCOUNTER — Other Ambulatory Visit: Payer: Self-pay

## 2019-08-30 ENCOUNTER — Ambulatory Visit (INDEPENDENT_AMBULATORY_CARE_PROVIDER_SITE_OTHER): Payer: Medicare Other | Admitting: Orthotics

## 2019-08-30 DIAGNOSIS — M2042 Other hammer toe(s) (acquired), left foot: Secondary | ICD-10-CM | POA: Diagnosis not present

## 2019-08-30 DIAGNOSIS — L84 Corns and callosities: Secondary | ICD-10-CM

## 2019-08-30 DIAGNOSIS — E1142 Type 2 diabetes mellitus with diabetic polyneuropathy: Secondary | ICD-10-CM

## 2019-08-30 DIAGNOSIS — M2041 Other hammer toe(s) (acquired), right foot: Secondary | ICD-10-CM | POA: Diagnosis not present

## 2019-08-30 DIAGNOSIS — Q828 Other specified congenital malformations of skin: Secondary | ICD-10-CM | POA: Diagnosis not present

## 2019-08-30 NOTE — Progress Notes (Signed)
Patient had appointment today for definitive and final diabetic shoe fitting and delivery.  Patient was seen by Betha, C.Ped, [facility].   The inserts fit well and accomplished full contact with the plantar surface of the foot bilateral; the shoes fit well and offered forefoot freedom, no noticible heel slippage, and good toe clearance w/ the insert in place.  Patient was advised to monitor of any skin irritation, breakdown.  Patient was satisfied with fit and function. 

## 2019-09-24 ENCOUNTER — Ambulatory Visit: Payer: Medicare Other | Admitting: Podiatry

## 2019-10-15 DIAGNOSIS — E1129 Type 2 diabetes mellitus with other diabetic kidney complication: Secondary | ICD-10-CM | POA: Diagnosis not present

## 2019-10-15 DIAGNOSIS — I10 Essential (primary) hypertension: Secondary | ICD-10-CM | POA: Diagnosis not present

## 2019-10-15 DIAGNOSIS — F79 Unspecified intellectual disabilities: Secondary | ICD-10-CM | POA: Diagnosis not present

## 2019-10-15 DIAGNOSIS — D2330 Other benign neoplasm of skin of unspecified part of face: Secondary | ICD-10-CM | POA: Diagnosis not present

## 2019-10-15 DIAGNOSIS — N1832 Chronic kidney disease, stage 3b: Secondary | ICD-10-CM | POA: Diagnosis not present

## 2019-10-15 DIAGNOSIS — L84 Corns and callosities: Secondary | ICD-10-CM | POA: Diagnosis not present

## 2019-10-15 DIAGNOSIS — I129 Hypertensive chronic kidney disease with stage 1 through stage 4 chronic kidney disease, or unspecified chronic kidney disease: Secondary | ICD-10-CM | POA: Diagnosis not present

## 2019-10-15 DIAGNOSIS — E785 Hyperlipidemia, unspecified: Secondary | ICD-10-CM | POA: Diagnosis not present

## 2019-10-15 DIAGNOSIS — G43009 Migraine without aura, not intractable, without status migrainosus: Secondary | ICD-10-CM | POA: Diagnosis not present

## 2019-10-15 DIAGNOSIS — E669 Obesity, unspecified: Secondary | ICD-10-CM | POA: Diagnosis not present

## 2019-10-15 DIAGNOSIS — Z794 Long term (current) use of insulin: Secondary | ICD-10-CM | POA: Diagnosis not present

## 2019-10-15 DIAGNOSIS — R251 Tremor, unspecified: Secondary | ICD-10-CM | POA: Diagnosis not present

## 2019-10-17 ENCOUNTER — Other Ambulatory Visit: Payer: Self-pay

## 2019-10-17 ENCOUNTER — Ambulatory Visit (INDEPENDENT_AMBULATORY_CARE_PROVIDER_SITE_OTHER): Payer: Medicare Other | Admitting: Podiatry

## 2019-10-17 DIAGNOSIS — L84 Corns and callosities: Secondary | ICD-10-CM

## 2019-10-17 DIAGNOSIS — E1142 Type 2 diabetes mellitus with diabetic polyneuropathy: Secondary | ICD-10-CM

## 2019-10-17 DIAGNOSIS — B351 Tinea unguium: Secondary | ICD-10-CM

## 2019-10-17 DIAGNOSIS — M2042 Other hammer toe(s) (acquired), left foot: Secondary | ICD-10-CM

## 2019-10-17 DIAGNOSIS — M2041 Other hammer toe(s) (acquired), right foot: Secondary | ICD-10-CM

## 2019-10-17 NOTE — Progress Notes (Signed)
  Subjective:  Patient ID: Sara Chung, female    DOB: November 07, 1975,  MRN: [id number]  Chief Complaint  Patient presents with  . debride    DFC  . Diabetes    FBS: 72 a1C: na PCP: [place] x "a while"    44 y.o. female presents with the above complaint. History confirmed with patient.   Objective:  Physical Exam: warm, good capillary refill, nail exam onychomycosis of the toenails, no trophic changes or ulcerative lesions. DP pulses palpable, PT pulses palpable and protective sensation absent Hammertoes bilat Pes planus bilat HPK submet 5 right  No images are attached to the encounter.  Assessment:   1. DM type 2 with diabetic peripheral neuropathy ([place])   2. Hammer toes of both feet   3. Callus    Plan:  Patient was evaluated and treated and all questions answered.  Onychomycosis, Diabetes and DPN -Patient is diabetic with a qualifying condition for at risk foot care. -Educated on DM Footcare.  Procedure: Nail Debridement Rationale: Patient meets criteria for routine foot care due to DPN Type of Debridement: manual, sharp debridement. Instrumentation: Nail nipper, rotary burr. Number of Nails: 10  Procedure: Paring of Lesion Rationale: painful hyperkeratotic lesion Type of Debridement: manual, sharp debridement. Instrumentation: 312 blade Number of Lesions: 1   No follow-ups on file.

## 2019-10-20 DIAGNOSIS — L0390 Cellulitis, unspecified: Secondary | ICD-10-CM | POA: Diagnosis not present

## 2019-10-20 DIAGNOSIS — L0291 Cutaneous abscess, unspecified: Secondary | ICD-10-CM | POA: Diagnosis not present

## 2019-11-08 DIAGNOSIS — Z3042 Encounter for surveillance of injectable contraceptive: Secondary | ICD-10-CM | POA: Diagnosis not present

## 2020-01-09 DIAGNOSIS — D224 Melanocytic nevi of scalp and neck: Secondary | ICD-10-CM | POA: Diagnosis not present

## 2020-01-09 DIAGNOSIS — L720 Epidermal cyst: Secondary | ICD-10-CM | POA: Diagnosis not present

## 2020-01-20 ENCOUNTER — Other Ambulatory Visit: Payer: Self-pay

## 2020-01-20 ENCOUNTER — Encounter: Payer: Self-pay | Admitting: Podiatry

## 2020-01-20 ENCOUNTER — Ambulatory Visit (INDEPENDENT_AMBULATORY_CARE_PROVIDER_SITE_OTHER): Payer: Medicare Other | Admitting: Podiatry

## 2020-01-20 DIAGNOSIS — L84 Corns and callosities: Secondary | ICD-10-CM | POA: Diagnosis not present

## 2020-01-20 DIAGNOSIS — B351 Tinea unguium: Secondary | ICD-10-CM

## 2020-01-20 DIAGNOSIS — E1142 Type 2 diabetes mellitus with diabetic polyneuropathy: Secondary | ICD-10-CM

## 2020-01-20 NOTE — Progress Notes (Signed)
  Subjective:  Patient ID: Sara Chung, female    DOB: [birth year],  MRN: [id number]  Chief Complaint  Patient presents with  . Nail Problem    trim nails    44 y.o. female presents with the above complaint. History confirmed with patient.   Objective:  Physical Exam: warm, good capillary refill, nail exam onychomycosis of the toenails, no trophic changes or ulcerative lesions. DP pulses palpable, PT pulses palpable and protective sensation absent Hammertoes bilat Pes planus bilat HPK submet 5 right  No images are attached to the encounter.  Assessment:   1. DM type 2 with diabetic peripheral neuropathy (HCC)   2. Callus   3. Onychomycosis    Plan:  Patient was evaluated and treated and all questions answered.  Onychomycosis, Diabetes and DPN -Patient is diabetic with a qualifying condition for at risk foot care.  Procedure: Nail Debridement Type of Debridement: manual, sharp debridement. Instrumentation: Nail nipper, rotary burr. Number of Nails: 10   Procedure: Paring of Lesion Rationale: painful hyperkeratotic lesion Type of Debridement: manual, sharp debridement. Instrumentation: 312 blade Number of Lesions: 1   Return in about 3 months (around 04/21/2020) for Diabetic Foot Care.

## 2020-02-04 DIAGNOSIS — L720 Epidermal cyst: Secondary | ICD-10-CM | POA: Diagnosis not present

## 2020-02-12 DIAGNOSIS — Z3202 Encounter for pregnancy test, result negative: Secondary | ICD-10-CM | POA: Diagnosis not present

## 2020-02-12 DIAGNOSIS — Z3042 Encounter for surveillance of injectable contraceptive: Secondary | ICD-10-CM | POA: Diagnosis not present

## 2020-02-14 DIAGNOSIS — G43009 Migraine without aura, not intractable, without status migrainosus: Secondary | ICD-10-CM | POA: Diagnosis not present

## 2020-02-14 DIAGNOSIS — F418 Other specified anxiety disorders: Secondary | ICD-10-CM | POA: Diagnosis not present

## 2020-02-14 DIAGNOSIS — L84 Corns and callosities: Secondary | ICD-10-CM | POA: Diagnosis not present

## 2020-02-14 DIAGNOSIS — I129 Hypertensive chronic kidney disease with stage 1 through stage 4 chronic kidney disease, or unspecified chronic kidney disease: Secondary | ICD-10-CM | POA: Diagnosis not present

## 2020-02-14 DIAGNOSIS — E785 Hyperlipidemia, unspecified: Secondary | ICD-10-CM | POA: Diagnosis not present

## 2020-02-14 DIAGNOSIS — E1129 Type 2 diabetes mellitus with other diabetic kidney complication: Secondary | ICD-10-CM | POA: Diagnosis not present

## 2020-02-14 DIAGNOSIS — Z794 Long term (current) use of insulin: Secondary | ICD-10-CM | POA: Diagnosis not present

## 2020-02-14 DIAGNOSIS — F79 Unspecified intellectual disabilities: Secondary | ICD-10-CM | POA: Diagnosis not present

## 2020-02-14 DIAGNOSIS — I10 Essential (primary) hypertension: Secondary | ICD-10-CM | POA: Diagnosis not present

## 2020-02-14 DIAGNOSIS — N390 Urinary tract infection, site not specified: Secondary | ICD-10-CM | POA: Diagnosis not present

## 2020-02-14 DIAGNOSIS — Z23 Encounter for immunization: Secondary | ICD-10-CM | POA: Diagnosis not present

## 2020-02-14 DIAGNOSIS — E538 Deficiency of other specified B group vitamins: Secondary | ICD-10-CM | POA: Diagnosis not present

## 2020-02-14 DIAGNOSIS — R300 Dysuria: Secondary | ICD-10-CM | POA: Diagnosis not present

## 2020-02-14 DIAGNOSIS — N1832 Chronic kidney disease, stage 3b: Secondary | ICD-10-CM | POA: Diagnosis not present

## 2020-04-23 ENCOUNTER — Other Ambulatory Visit: Payer: Self-pay

## 2020-04-23 ENCOUNTER — Ambulatory Visit (INDEPENDENT_AMBULATORY_CARE_PROVIDER_SITE_OTHER): Payer: Medicare Other | Admitting: Podiatry

## 2020-04-23 DIAGNOSIS — E1142 Type 2 diabetes mellitus with diabetic polyneuropathy: Secondary | ICD-10-CM

## 2020-04-23 DIAGNOSIS — B351 Tinea unguium: Secondary | ICD-10-CM

## 2020-04-23 DIAGNOSIS — L84 Corns and callosities: Secondary | ICD-10-CM

## 2020-05-01 DIAGNOSIS — Z3042 Encounter for surveillance of injectable contraceptive: Secondary | ICD-10-CM | POA: Diagnosis not present

## 2020-05-04 NOTE — Progress Notes (Signed)
  Subjective:  Patient ID: Sara Chung, female    DOB: [birth year],  MRN: [id number]  Chief Complaint  Patient presents with  . debridment    Bl nails trimming   45 y.o. female presents with the above complaint. History confirmed with patient.   Objective:  Physical Exam: warm, good capillary refill, nail exam onychomycosis of the toenails, no trophic changes or ulcerative lesions. DP pulses palpable, PT pulses palpable and protective sensation absent Hammertoes bilat Pes planus bilat HPK submet 5 right  No images are attached to the encounter.  Assessment:   1. DM type 2 with diabetic peripheral neuropathy (HCC)   2. Callus   3. Onychomycosis    Plan:  Patient was evaluated and treated and all questions answered.  Onychomycosis, Diabetes and DPN -Patient is diabetic with a qualifying condition for at risk foot care.   Procedure: Nail Debridement Type of Debridement: manual, sharp debridement. Instrumentation: Nail nipper, rotary burr. Number of Nails: 10   Procedure: Paring of Lesion Rationale: painful hyperkeratotic lesion Type of Debridement: manual, sharp debridement. Instrumentation: 312 blade Number of Lesions: 1  No follow-ups on file.

## 2020-06-15 DIAGNOSIS — E785 Hyperlipidemia, unspecified: Secondary | ICD-10-CM | POA: Diagnosis not present

## 2020-06-15 DIAGNOSIS — I129 Hypertensive chronic kidney disease with stage 1 through stage 4 chronic kidney disease, or unspecified chronic kidney disease: Secondary | ICD-10-CM | POA: Diagnosis not present

## 2020-06-15 DIAGNOSIS — M25532 Pain in left wrist: Secondary | ICD-10-CM | POA: Diagnosis not present

## 2020-06-15 DIAGNOSIS — R109 Unspecified abdominal pain: Secondary | ICD-10-CM | POA: Diagnosis not present

## 2020-06-15 DIAGNOSIS — E1129 Type 2 diabetes mellitus with other diabetic kidney complication: Secondary | ICD-10-CM | POA: Diagnosis not present

## 2020-06-15 DIAGNOSIS — F418 Other specified anxiety disorders: Secondary | ICD-10-CM | POA: Diagnosis not present

## 2020-06-15 DIAGNOSIS — G43009 Migraine without aura, not intractable, without status migrainosus: Secondary | ICD-10-CM | POA: Diagnosis not present

## 2020-06-15 DIAGNOSIS — Z794 Long term (current) use of insulin: Secondary | ICD-10-CM | POA: Diagnosis not present

## 2020-06-15 DIAGNOSIS — F79 Unspecified intellectual disabilities: Secondary | ICD-10-CM | POA: Diagnosis not present

## 2020-06-15 DIAGNOSIS — N1831 Chronic kidney disease, stage 3a: Secondary | ICD-10-CM | POA: Diagnosis not present

## 2020-06-15 DIAGNOSIS — E669 Obesity, unspecified: Secondary | ICD-10-CM | POA: Diagnosis not present

## 2020-06-15 DIAGNOSIS — E538 Deficiency of other specified B group vitamins: Secondary | ICD-10-CM | POA: Diagnosis not present

## 2020-07-06 DIAGNOSIS — Z1239 Encounter for other screening for malignant neoplasm of breast: Secondary | ICD-10-CM | POA: Diagnosis not present

## 2020-07-21 DIAGNOSIS — Z3042 Encounter for surveillance of injectable contraceptive: Secondary | ICD-10-CM | POA: Diagnosis not present

## 2020-07-23 ENCOUNTER — Ambulatory Visit (INDEPENDENT_AMBULATORY_CARE_PROVIDER_SITE_OTHER): Payer: Medicare Other | Admitting: Podiatry

## 2020-07-23 ENCOUNTER — Other Ambulatory Visit: Payer: Self-pay

## 2020-07-23 ENCOUNTER — Encounter: Payer: Self-pay | Admitting: Podiatry

## 2020-07-23 ENCOUNTER — Other Ambulatory Visit: Payer: Self-pay | Admitting: *Deleted

## 2020-07-23 DIAGNOSIS — B351 Tinea unguium: Secondary | ICD-10-CM | POA: Diagnosis not present

## 2020-07-23 DIAGNOSIS — L84 Corns and callosities: Secondary | ICD-10-CM

## 2020-07-23 DIAGNOSIS — E1142 Type 2 diabetes mellitus with diabetic polyneuropathy: Secondary | ICD-10-CM | POA: Diagnosis not present

## 2020-07-23 NOTE — Progress Notes (Signed)
  Subjective:  Patient ID: Sara Chung, female    DOB: 11-20-1975,  MRN: [id number]  Chief Complaint  Patient presents with  . Nail Problem    Trim nails  . Callouses    I have a callus on the ball of the right foot    45 y.o. female presents with the above complaint. History confirmed with patient.   Objective:  Physical Exam: warm, good capillary refill, nail exam onychomycosis of the toenails, no trophic changes or ulcerative lesions. DP pulses palpable, PT pulses palpable and protective sensation absent Hammertoes bilat Pes planus bilat HPK submet 5 right  No images are attached to the encounter.  Assessment:   1. DM type 2 with diabetic peripheral neuropathy (HCC)   2. Callus   3. Onychomycosis    Plan:  Patient was evaluated and treated and all questions answered.  Onychomycosis, Diabetes and DPN -Patient is diabetic with a qualifying condition for at risk foot care. -Would benefit from new DM shoes. Will make appt for fabrication  Procedure: Nail Debridement Type of Debridement: manual, sharp debridement. Instrumentation: Nail nipper, rotary burr. Number of Nails: 10   Procedure: Paring of Lesion Rationale: painful hyperkeratotic lesion Type of Debridement: manual, sharp debridement. Instrumentation: 312 blade Number of Lesions: 2   No follow-ups on file.

## 2020-08-05 ENCOUNTER — Ambulatory Visit (INDEPENDENT_AMBULATORY_CARE_PROVIDER_SITE_OTHER): Payer: Medicare Other

## 2020-08-05 ENCOUNTER — Other Ambulatory Visit: Payer: Self-pay

## 2020-08-05 DIAGNOSIS — E1142 Type 2 diabetes mellitus with diabetic polyneuropathy: Secondary | ICD-10-CM

## 2020-08-05 NOTE — Progress Notes (Signed)
Patient in office today for diabetic shoe measurement and selection. Patient is a size 8 1/2 wide in women's shoes. Dr. Forde Dandy is the provider that treats her diabetes. Patient selected A600W ([employer]) as the 1st choice and A301W ([employer]) as the back-up choice.

## 2020-08-10 DIAGNOSIS — Z1231 Encounter for screening mammogram for malignant neoplasm of breast: Secondary | ICD-10-CM | POA: Diagnosis not present

## 2020-09-01 DIAGNOSIS — E119 Type 2 diabetes mellitus without complications: Secondary | ICD-10-CM | POA: Diagnosis not present

## 2020-10-08 DIAGNOSIS — Z3042 Encounter for surveillance of injectable contraceptive: Secondary | ICD-10-CM | POA: Diagnosis not present

## 2020-10-19 DIAGNOSIS — E538 Deficiency of other specified B group vitamins: Secondary | ICD-10-CM | POA: Diagnosis not present

## 2020-10-19 DIAGNOSIS — E669 Obesity, unspecified: Secondary | ICD-10-CM | POA: Diagnosis not present

## 2020-10-19 DIAGNOSIS — I129 Hypertensive chronic kidney disease with stage 1 through stage 4 chronic kidney disease, or unspecified chronic kidney disease: Secondary | ICD-10-CM | POA: Diagnosis not present

## 2020-10-19 DIAGNOSIS — G43009 Migraine without aura, not intractable, without status migrainosus: Secondary | ICD-10-CM | POA: Diagnosis not present

## 2020-10-19 DIAGNOSIS — F79 Unspecified intellectual disabilities: Secondary | ICD-10-CM | POA: Diagnosis not present

## 2020-10-19 DIAGNOSIS — F418 Other specified anxiety disorders: Secondary | ICD-10-CM | POA: Diagnosis not present

## 2020-10-19 DIAGNOSIS — E785 Hyperlipidemia, unspecified: Secondary | ICD-10-CM | POA: Diagnosis not present

## 2020-10-19 DIAGNOSIS — N200 Calculus of kidney: Secondary | ICD-10-CM | POA: Diagnosis not present

## 2020-10-19 DIAGNOSIS — Z794 Long term (current) use of insulin: Secondary | ICD-10-CM | POA: Diagnosis not present

## 2020-10-19 DIAGNOSIS — N1832 Chronic kidney disease, stage 3b: Secondary | ICD-10-CM | POA: Diagnosis not present

## 2020-10-19 DIAGNOSIS — E1129 Type 2 diabetes mellitus with other diabetic kidney complication: Secondary | ICD-10-CM | POA: Diagnosis not present

## 2020-10-19 DIAGNOSIS — I10 Essential (primary) hypertension: Secondary | ICD-10-CM | POA: Diagnosis not present

## 2020-10-26 ENCOUNTER — Ambulatory Visit (INDEPENDENT_AMBULATORY_CARE_PROVIDER_SITE_OTHER): Payer: Medicare Other | Admitting: Podiatry

## 2020-10-26 DIAGNOSIS — M2042 Other hammer toe(s) (acquired), left foot: Secondary | ICD-10-CM | POA: Diagnosis not present

## 2020-10-26 DIAGNOSIS — B351 Tinea unguium: Secondary | ICD-10-CM

## 2020-10-26 DIAGNOSIS — L84 Corns and callosities: Secondary | ICD-10-CM

## 2020-10-26 DIAGNOSIS — M2041 Other hammer toe(s) (acquired), right foot: Secondary | ICD-10-CM | POA: Diagnosis not present

## 2020-10-26 DIAGNOSIS — E1142 Type 2 diabetes mellitus with diabetic polyneuropathy: Secondary | ICD-10-CM

## 2020-10-26 NOTE — Progress Notes (Signed)
  Subjective:  Patient ID: Sara Chung, female    DOB: 05/23/1975,  MRN: [id number]  No chief complaint on file.  45 y.o. female presents with the above complaint. History confirmed with patient. Here for care of her nails. Denies new pedal issues.  Objective:  Physical Exam: warm, good capillary refill, nail exam onychomycosis of the toenails, no trophic changes or ulcerative lesions. DP pulses palpable, PT pulses palpable and protective sensation absent Hammertoes bilat Pes planus bilat HPK submet 5 bilat  No images are attached to the encounter.  Assessment:   1. DM type 2 with diabetic peripheral neuropathy ([place])   2. Onychomycosis   3. Callus   4. Hammer toes of both feet    Plan:  Patient was evaluated and treated and all questions answered.  Onychomycosis, Diabetes and DPN -Patient is diabetic with a qualifying condition for at risk foot care. -Dispensed DM shoes today. Written and verbal break-in instructions reviewed with patient.  Procedure: Nail Debridement Type of Debridement: manual, sharp debridement. Instrumentation: Nail nipper, rotary burr. Number of Nails: 10   Procedure: Paring of Lesion Rationale: painful hyperkeratotic lesion Type of Debridement: manual, sharp debridement. Instrumentation: 312 blade Number of Lesions: 2   Return in about 3 months (around 01/26/2021) for Diabetic Foot Care.

## 2020-12-23 DIAGNOSIS — Z3042 Encounter for surveillance of injectable contraceptive: Secondary | ICD-10-CM | POA: Diagnosis not present

## 2021-01-12 DIAGNOSIS — Z23 Encounter for immunization: Secondary | ICD-10-CM | POA: Diagnosis not present

## 2021-02-01 ENCOUNTER — Other Ambulatory Visit: Payer: Self-pay

## 2021-02-01 ENCOUNTER — Ambulatory Visit (INDEPENDENT_AMBULATORY_CARE_PROVIDER_SITE_OTHER): Payer: Medicare Other | Admitting: Podiatry

## 2021-02-01 DIAGNOSIS — B351 Tinea unguium: Secondary | ICD-10-CM

## 2021-02-01 DIAGNOSIS — L84 Corns and callosities: Secondary | ICD-10-CM | POA: Diagnosis not present

## 2021-02-01 DIAGNOSIS — E1142 Type 2 diabetes mellitus with diabetic polyneuropathy: Secondary | ICD-10-CM | POA: Diagnosis not present

## 2021-02-01 NOTE — Progress Notes (Signed)
  Subjective:  Patient ID: Sara Chung, female    DOB: August 12, 1975,  MRN: [id number]  Chief Complaint  Patient presents with   debride    [facility] -BL nails trimming -pt is returning DM shoes, they fit too tifht   45 y.o. female presents with the above complaint. History confirmed with patient.   Objective:  Physical Exam: warm, good capillary refill, nail exam onychomycosis of the toenails, no trophic changes or ulcerative lesions. DP pulses palpable, PT pulses palpable and protective sensation absent Hammertoes bilat Pes planus bilat HPK submet 5 bilat  No images are attached to the encounter.  Assessment:   1. DM type 2 with diabetic peripheral neuropathy ([place])   2. Onychomycosis   3. Callus    Plan:  Patient was evaluated and treated and all questions answered.  Onychomycosis, Diabetes and DPN -Patient is diabetic with a qualifying condition for at risk foot care. -Patient picked new DM shoes as previous did not fit. Will order size 8.5 boots   Procedure: Nail Debridement Type of Debridement: manual, sharp debridement. Instrumentation: Nail nipper, rotary burr. Number of Nails: 10  Procedure: Paring of Lesion Rationale: painful hyperkeratotic lesion Type of Debridement: manual, sharp debridement. Instrumentation: 312 blade Number of Lesions: 3   No follow-ups on file.

## 2021-02-17 ENCOUNTER — Ambulatory Visit: Payer: Medicare Other

## 2021-02-18 DIAGNOSIS — E785 Hyperlipidemia, unspecified: Secondary | ICD-10-CM | POA: Diagnosis not present

## 2021-02-18 DIAGNOSIS — N200 Calculus of kidney: Secondary | ICD-10-CM | POA: Diagnosis not present

## 2021-02-18 DIAGNOSIS — E669 Obesity, unspecified: Secondary | ICD-10-CM | POA: Diagnosis not present

## 2021-02-18 DIAGNOSIS — E538 Deficiency of other specified B group vitamins: Secondary | ICD-10-CM | POA: Diagnosis not present

## 2021-02-18 DIAGNOSIS — F79 Unspecified intellectual disabilities: Secondary | ICD-10-CM | POA: Diagnosis not present

## 2021-02-18 DIAGNOSIS — G43009 Migraine without aura, not intractable, without status migrainosus: Secondary | ICD-10-CM | POA: Diagnosis not present

## 2021-02-18 DIAGNOSIS — E1129 Type 2 diabetes mellitus with other diabetic kidney complication: Secondary | ICD-10-CM | POA: Diagnosis not present

## 2021-02-18 DIAGNOSIS — N1831 Chronic kidney disease, stage 3a: Secondary | ICD-10-CM | POA: Diagnosis not present

## 2021-02-18 DIAGNOSIS — I129 Hypertensive chronic kidney disease with stage 1 through stage 4 chronic kidney disease, or unspecified chronic kidney disease: Secondary | ICD-10-CM | POA: Diagnosis not present

## 2021-02-18 DIAGNOSIS — Z794 Long term (current) use of insulin: Secondary | ICD-10-CM | POA: Diagnosis not present

## 2021-03-11 DIAGNOSIS — Z3042 Encounter for surveillance of injectable contraceptive: Secondary | ICD-10-CM | POA: Diagnosis not present

## 2021-03-25 ENCOUNTER — Ambulatory Visit (INDEPENDENT_AMBULATORY_CARE_PROVIDER_SITE_OTHER): Payer: Medicare Other | Admitting: Podiatry

## 2021-03-25 ENCOUNTER — Encounter: Payer: Self-pay | Admitting: Podiatry

## 2021-03-25 ENCOUNTER — Other Ambulatory Visit: Payer: Self-pay

## 2021-03-25 DIAGNOSIS — M7741 Metatarsalgia, right foot: Secondary | ICD-10-CM

## 2021-03-25 NOTE — Progress Notes (Signed)
°  Subjective:  Patient ID: Sara Chung, female    DOB: [birth year],  MRN: [id number]  Chief Complaint  Patient presents with   Callouses    I have a few calluses that need to be trimmed    46 y.o. female presents with the above complaint. History confirmed with patient. Having pain in the ball of the foot with some calluses.  Objective:  Physical Exam: warm, good capillary refill, nail exam onychomycosis of the toenails, no trophic changes or ulcerative lesions. DP pulses palpable, PT pulses palpable and protective sensation absent Hammertoes bilat Pes planus bilat HPK submet 3/5 right  No images are attached to the encounter.  Assessment:   1. Metatarsalgia of right foot    Plan:  Patient was evaluated and treated and all questions answered.  Onychomycosis, Diabetes and DPN -Minimal debridement of HPKS right foot. Will see in a few weeks for full at risk foot care.  No follow-ups on file.

## 2021-04-28 DIAGNOSIS — J069 Acute upper respiratory infection, unspecified: Secondary | ICD-10-CM | POA: Diagnosis not present

## 2021-05-04 DIAGNOSIS — J329 Chronic sinusitis, unspecified: Secondary | ICD-10-CM | POA: Diagnosis not present

## 2021-05-04 DIAGNOSIS — H109 Unspecified conjunctivitis: Secondary | ICD-10-CM | POA: Diagnosis not present

## 2021-05-04 DIAGNOSIS — J40 Bronchitis, not specified as acute or chronic: Secondary | ICD-10-CM | POA: Diagnosis not present

## 2021-05-06 ENCOUNTER — Ambulatory Visit: Payer: Medicare Other | Admitting: Podiatry

## 2021-05-07 ENCOUNTER — Ambulatory Visit: Payer: Medicare Other | Admitting: Sports Medicine

## 2021-05-20 DIAGNOSIS — Z794 Long term (current) use of insulin: Secondary | ICD-10-CM | POA: Diagnosis not present

## 2021-05-20 DIAGNOSIS — R251 Tremor, unspecified: Secondary | ICD-10-CM | POA: Diagnosis not present

## 2021-05-20 DIAGNOSIS — E669 Obesity, unspecified: Secondary | ICD-10-CM | POA: Diagnosis not present

## 2021-05-20 DIAGNOSIS — N1831 Chronic kidney disease, stage 3a: Secondary | ICD-10-CM | POA: Diagnosis not present

## 2021-05-20 DIAGNOSIS — I129 Hypertensive chronic kidney disease with stage 1 through stage 4 chronic kidney disease, or unspecified chronic kidney disease: Secondary | ICD-10-CM | POA: Diagnosis not present

## 2021-05-20 DIAGNOSIS — F79 Unspecified intellectual disabilities: Secondary | ICD-10-CM | POA: Diagnosis not present

## 2021-05-20 DIAGNOSIS — E785 Hyperlipidemia, unspecified: Secondary | ICD-10-CM | POA: Diagnosis not present

## 2021-05-20 DIAGNOSIS — E1129 Type 2 diabetes mellitus with other diabetic kidney complication: Secondary | ICD-10-CM | POA: Diagnosis not present

## 2021-05-20 DIAGNOSIS — E538 Deficiency of other specified B group vitamins: Secondary | ICD-10-CM | POA: Diagnosis not present

## 2021-05-21 ENCOUNTER — Encounter: Payer: Self-pay | Admitting: Sports Medicine

## 2021-05-21 ENCOUNTER — Ambulatory Visit (INDEPENDENT_AMBULATORY_CARE_PROVIDER_SITE_OTHER): Payer: Medicare Other | Admitting: Sports Medicine

## 2021-05-21 ENCOUNTER — Other Ambulatory Visit: Payer: Self-pay

## 2021-05-21 DIAGNOSIS — F79 Unspecified intellectual disabilities: Secondary | ICD-10-CM

## 2021-05-21 DIAGNOSIS — M79609 Pain in unspecified limb: Secondary | ICD-10-CM | POA: Diagnosis not present

## 2021-05-21 DIAGNOSIS — B351 Tinea unguium: Secondary | ICD-10-CM | POA: Diagnosis not present

## 2021-05-21 DIAGNOSIS — L84 Corns and callosities: Secondary | ICD-10-CM

## 2021-05-21 DIAGNOSIS — E1142 Type 2 diabetes mellitus with diabetic polyneuropathy: Secondary | ICD-10-CM

## 2021-05-21 NOTE — Progress Notes (Signed)
Subjective: ?Sara Chung is a 46 y.o. female patient with history of diabetes who presents to office today complaining of long,mildly painful nails and callus while ambulating in shoes; unable to trim. Patient states that the glucose reading this morning was good and last visit to PCP Dr. [name] was last month and also saw Dr. Forde Dandy on yesterday.  Patient denies any new problems at this time and is assisted by sister who is also receiving care this visit. ? ?Patient Active Problem List  ? Diagnosis Date Noted  ? Intellectual disability 05/28/2019  ? ?Current Outpatient Medications on File Prior to Visit  ?Medication Sig Dispense Refill  ? BD PEN NEEDLE NANO U/F 32G X 4 MM MISC U QID UTD    ? Canagliflozin (INVOKANA) 300 MG TABS Take by mouth.    ? carisoprodol (SOMA) 350 MG tablet   2  ? CRESTOR 10 MG tablet   6  ? divalproex (DEPAKOTE) 250 MG DR tablet Take by mouth.    ? doxycycline (VIBRA-TABS) 100 MG tablet Take 1 capsule by mouth 2 (two) times daily.    ? escitalopram (LEXAPRO) 10 MG tablet TK 1 T PO QD    ? estradiol cypionate (DEPO-ESTRADIOL) 5 MG/ML injection Inject into the muscle every 28 (twenty-eight) days.    ? glimepiride (AMARYL) 2 MG tablet   6  ? glipiZIDE (GLUCOTROL) 10 MG tablet Take 10 mg by mouth daily before breakfast.    ? glucose blood (PRECISION QID TEST) test strip OneTouch Ultra Test strips ? USE TO CHECK BLOOD SUGAR QID    ? insulin glargine (LANTUS) 100 UNIT/ML injection Inject into the skin at bedtime.    ? LANTUS SOLOSTAR 100 UNIT/ML Solostar Pen   5  ? medroxyPROGESTERone (DEPO-PROVERA) 150 MG/ML injection ADMINISTER 1 ML(150 MG) IN THE MUSCLE EVERY 3 MONTHS    ? metFORMIN (GLUCOPHAGE) 500 MG tablet Take by mouth 2 (two) times daily with a meal.    ? montelukast (SINGULAIR) 10 MG tablet TK 1 T PO QD    ? mupirocin ointment (BACTROBAN) 2 % APPLY TO BOTH NOSTRILS TWICE A DAY FOR 5 DAYS    ? nitrofurantoin, macrocrystal-monohydrate, (MACROBID) 100 MG capsule     ? NOVOLOG FLEXPEN  100 UNIT/ML FlexPen INJ 8 UNITS [place] WITH EACH MEAL    ? pioglitazone-metformin (ACTOPLUS MET) 15-850 MG per tablet   5  ? topiramate (TOPAMAX) 25 MG tablet   5  ? trimethoprim-polymyxin b (POLYTRIM) ophthalmic solution SMARTSIG:In Eye(s)    ? valACYclovir (VALTREX) 1000 MG tablet   0  ? valACYclovir (VALTREX) 500 MG tablet Take by mouth.    ? valACYclovir (VALTREX) 500 MG tablet Take by mouth.    ? venlafaxine (EFFEXOR) 75 MG tablet Take 1 tablet by mouth daily.    ? ZOVIRAX 5 %   6  ? ?No current facility-administered medications on file prior to visit.  ? ?Allergies  ?Allergen Reactions  ? Latex   ? ? ?No results found for this or any previous visit (from the past 2160 hour(s)). ? ?Objective: ?General: Patient is awake, alert, and oriented x 2 and in no acute distress.  Patient has a intellectual disability. ? ?Integument: Skin is warm, dry and supple bilateral. Nails are tender, long, thickened and dystrophic with subungual debris, consistent with onychomycosis, 1-5 bilateral. No signs of infection.  There is plantar forefoot callus noted on the right submet 3 and 5 without signs of infection.  Remaining integument unremarkable. ? ?Vasculature:  Dorsalis Pedis pulse 1/4 bilateral. Posterior Tibial pulse 1/4 bilateral. Capillary fill time <3 sec 1-5 bilateral. Positive hair growth to the level of the digits.Temperature gradient within normal limits. No varicosities present bilateral. No edema present bilateral.  ? ?Neurology: Difficult to discern due to patient status however patient reports that she is able to feel vibration from tuning fork and light stroke to the plantar aspect of both feet with this device. ? ?Musculoskeletal: Asymptomatic hammertoe and fat pad displacement pedal deformities noted bilateral.  Pes planus foot type.  Muscular strength 5/5 in all lower extremity muscular groups bilateral without pain on range of motion. ? ?Assessment and Plan: ?Problem List Items Addressed This Visit   ? ?  ?  Other  ? Intellectual disability  ? ?Other Visit Diagnoses   ? ? Pain due to onychomycosis of nail    -  Primary  ? Callus      ? DM type 2 with diabetic peripheral neuropathy ([place])      ? ?  ? ? ?-Examined patient. ?-Discussed and educated patient on diabetic foot care, especially with  ?regards to the vascular, neurological and musculoskeletal systems.  ?-Stressed the importance of good glycemic control and the detriment of not  ?controlling glucose levels in relation to the foot. ?-Mechanically debrided all painful nails 1-5 bilateral using sterile nail nipper and filed with dremel without incident  ?-Mechanically debrided all painful keratoses x2 on the right foot plantar third and fifth metatarsal heads using a sterile 15 blade without incident ?-Advised good supportive shoes daily for foot type ?-Answered all patient questions ?-Patient to return  in 3 months for at risk foot care with Dr. Elisha Ponder as scheduled ?-Patient's sister who is also her caregiver advised to call the office if any problems or questions arise in the meantime. ? ?Landis Martins, DPM  ?

## 2021-05-29 DIAGNOSIS — H6123 Impacted cerumen, bilateral: Secondary | ICD-10-CM | POA: Diagnosis not present

## 2021-05-29 DIAGNOSIS — J019 Acute sinusitis, unspecified: Secondary | ICD-10-CM | POA: Diagnosis not present

## 2021-05-29 DIAGNOSIS — J209 Acute bronchitis, unspecified: Secondary | ICD-10-CM | POA: Diagnosis not present

## 2021-06-03 DIAGNOSIS — J301 Allergic rhinitis due to pollen: Secondary | ICD-10-CM | POA: Diagnosis not present

## 2021-06-03 DIAGNOSIS — J40 Bronchitis, not specified as acute or chronic: Secondary | ICD-10-CM | POA: Diagnosis not present

## 2021-06-03 DIAGNOSIS — B3731 Acute candidiasis of vulva and vagina: Secondary | ICD-10-CM | POA: Diagnosis not present

## 2021-06-03 DIAGNOSIS — Z6834 Body mass index (BMI) 34.0-34.9, adult: Secondary | ICD-10-CM | POA: Diagnosis not present

## 2021-06-04 DIAGNOSIS — Z3042 Encounter for surveillance of injectable contraceptive: Secondary | ICD-10-CM | POA: Diagnosis not present

## 2021-06-04 DIAGNOSIS — Z01419 Encounter for gynecological examination (general) (routine) without abnormal findings: Secondary | ICD-10-CM | POA: Diagnosis not present

## 2021-06-19 DIAGNOSIS — J0100 Acute maxillary sinusitis, unspecified: Secondary | ICD-10-CM | POA: Diagnosis not present

## 2021-06-19 DIAGNOSIS — H6692 Otitis media, unspecified, left ear: Secondary | ICD-10-CM | POA: Diagnosis not present

## 2021-07-15 DIAGNOSIS — B3749 Other urogenital candidiasis: Secondary | ICD-10-CM | POA: Diagnosis not present

## 2021-07-15 DIAGNOSIS — E1122 Type 2 diabetes mellitus with diabetic chronic kidney disease: Secondary | ICD-10-CM | POA: Diagnosis not present

## 2021-07-15 DIAGNOSIS — I129 Hypertensive chronic kidney disease with stage 1 through stage 4 chronic kidney disease, or unspecified chronic kidney disease: Secondary | ICD-10-CM | POA: Diagnosis not present

## 2021-07-15 DIAGNOSIS — N1832 Chronic kidney disease, stage 3b: Secondary | ICD-10-CM | POA: Diagnosis not present

## 2021-07-15 DIAGNOSIS — E785 Hyperlipidemia, unspecified: Secondary | ICD-10-CM | POA: Diagnosis not present

## 2021-07-15 DIAGNOSIS — N390 Urinary tract infection, site not specified: Secondary | ICD-10-CM | POA: Diagnosis not present

## 2021-07-16 ENCOUNTER — Other Ambulatory Visit: Payer: Self-pay | Admitting: Nephrology

## 2021-07-16 DIAGNOSIS — N390 Urinary tract infection, site not specified: Secondary | ICD-10-CM

## 2021-07-16 DIAGNOSIS — B3749 Other urogenital candidiasis: Secondary | ICD-10-CM

## 2021-07-16 DIAGNOSIS — E785 Hyperlipidemia, unspecified: Secondary | ICD-10-CM

## 2021-07-16 DIAGNOSIS — I129 Hypertensive chronic kidney disease with stage 1 through stage 4 chronic kidney disease, or unspecified chronic kidney disease: Secondary | ICD-10-CM

## 2021-07-16 DIAGNOSIS — N1832 Chronic kidney disease, stage 3b: Secondary | ICD-10-CM

## 2021-07-16 DIAGNOSIS — E1122 Type 2 diabetes mellitus with diabetic chronic kidney disease: Secondary | ICD-10-CM

## 2021-07-20 DIAGNOSIS — N281 Cyst of kidney, acquired: Secondary | ICD-10-CM | POA: Diagnosis not present

## 2021-07-20 DIAGNOSIS — N189 Chronic kidney disease, unspecified: Secondary | ICD-10-CM | POA: Diagnosis not present

## 2021-07-20 DIAGNOSIS — N1832 Chronic kidney disease, stage 3b: Secondary | ICD-10-CM | POA: Diagnosis not present

## 2021-07-20 DIAGNOSIS — N2889 Other specified disorders of kidney and ureter: Secondary | ICD-10-CM | POA: Diagnosis not present

## 2021-07-20 DIAGNOSIS — R188 Other ascites: Secondary | ICD-10-CM | POA: Diagnosis not present

## 2021-07-28 ENCOUNTER — Encounter: Payer: Self-pay | Admitting: Podiatry

## 2021-07-28 ENCOUNTER — Ambulatory Visit (INDEPENDENT_AMBULATORY_CARE_PROVIDER_SITE_OTHER): Payer: Medicare Other | Admitting: Podiatry

## 2021-07-28 DIAGNOSIS — N1832 Chronic kidney disease, stage 3b: Secondary | ICD-10-CM | POA: Diagnosis not present

## 2021-07-28 DIAGNOSIS — B351 Tinea unguium: Secondary | ICD-10-CM | POA: Diagnosis not present

## 2021-07-28 DIAGNOSIS — M79674 Pain in right toe(s): Secondary | ICD-10-CM | POA: Diagnosis not present

## 2021-07-28 DIAGNOSIS — M79675 Pain in left toe(s): Secondary | ICD-10-CM | POA: Diagnosis not present

## 2021-07-28 DIAGNOSIS — L84 Corns and callosities: Secondary | ICD-10-CM | POA: Diagnosis not present

## 2021-07-28 DIAGNOSIS — F79 Unspecified intellectual disabilities: Secondary | ICD-10-CM

## 2021-07-28 DIAGNOSIS — E1142 Type 2 diabetes mellitus with diabetic polyneuropathy: Secondary | ICD-10-CM | POA: Diagnosis not present

## 2021-07-28 DIAGNOSIS — N390 Urinary tract infection, site not specified: Secondary | ICD-10-CM | POA: Diagnosis not present

## 2021-07-28 NOTE — Progress Notes (Signed)
Subjective: Sara Chung is a 46 y.o. female patient with history of diabetes who presents to office today complaining of long,mildly painful nails and callus while ambulating in shoes; unable to trim.  Patient denies any new problems at this time and is assisted by sister who is also receiving care this visit.  PCP: Lovette Cliche MD   Patient Active Problem List   Diagnosis Date Noted   Intellectual disability 05/28/2019   Current Outpatient Medications on File Prior to Visit  Medication Sig Dispense Refill   BD PEN NEEDLE NANO U/F 32G X 4 MM MISC U QID UTD     Canagliflozin (INVOKANA) 300 MG TABS Take by mouth.     carisoprodol (SOMA) 350 MG tablet   2   CRESTOR 10 MG tablet   6   divalproex (DEPAKOTE) 250 MG DR tablet Take by mouth.     doxycycline (VIBRA-TABS) 100 MG tablet Take 1 capsule by mouth 2 (two) times daily.     escitalopram (LEXAPRO) 10 MG tablet TK 1 T PO QD     estradiol cypionate (DEPO-ESTRADIOL) 5 MG/ML injection Inject into the muscle every 28 (twenty-eight) days.     glimepiride (AMARYL) 2 MG tablet   6   glipiZIDE (GLUCOTROL) 10 MG tablet Take 10 mg by mouth daily before breakfast.     glucose blood (PRECISION QID TEST) test strip OneTouch Ultra Test strips  USE TO CHECK BLOOD SUGAR QID     insulin glargine (LANTUS) 100 UNIT/ML injection Inject into the skin at bedtime.     LANTUS SOLOSTAR 100 UNIT/ML Solostar Pen   5   medroxyPROGESTERone (DEPO-PROVERA) 150 MG/ML injection ADMINISTER 1 ML(150 MG) IN THE MUSCLE EVERY 3 MONTHS     metFORMIN (GLUCOPHAGE) 500 MG tablet Take by mouth 2 (two) times daily with a meal.     montelukast (SINGULAIR) 10 MG tablet TK 1 T PO QD     mupirocin ointment (BACTROBAN) 2 % APPLY TO BOTH NOSTRILS TWICE A DAY FOR 5 DAYS     nitrofurantoin, macrocrystal-monohydrate, (MACROBID) 100 MG capsule      NOVOLOG FLEXPEN 100 UNIT/ML FlexPen INJ 8 UNITS [place] WITH EACH MEAL     pioglitazone-metformin (ACTOPLUS MET) 15-850 MG per tablet   5    topiramate (TOPAMAX) 25 MG tablet   5   trimethoprim-polymyxin b (POLYTRIM) ophthalmic solution SMARTSIG:In Eye(s)     valACYclovir (VALTREX) 1000 MG tablet   0   valACYclovir (VALTREX) 500 MG tablet Take by mouth.     valACYclovir (VALTREX) 500 MG tablet Take by mouth.     venlafaxine (EFFEXOR) 75 MG tablet Take 1 tablet by mouth daily.     ZOVIRAX 5 %   6   No current facility-administered medications on file prior to visit.   Allergies  Allergen Reactions   Latex     No results found for this or any previous visit (from the past 2160 hour(s)).  Objective: General: Patient is awake, alert, and oriented x 2 and in no acute distress.  Patient has a intellectual disability.  Integument: Skin is warm, dry and supple bilateral. Nails are tender, long, thickened and dystrophic with subungual debris, consistent with onychomycosis, 1-5 bilateral. No signs of infection.  There is plantar forefoot callus noted on the right submet 3 and 5 without signs of infection.  Remaining integument unremarkable.  Vasculature:  Dorsalis Pedis pulse 1/4 bilateral. Posterior Tibial pulse 1/4 bilateral. Capillary fill time <3 sec 1-5 bilateral. Positive hair growth to  the level of the digits.Temperature gradient within normal limits. No varicosities present bilateral. No edema present bilateral.   Neurology: Difficult to discern due to patient status however patient reports that she is able to feel vibration from tuning fork and light stroke to the plantar aspect of both feet with this device.  Musculoskeletal: Asymptomatic hammertoe and fat pad displacement pedal deformities noted bilateral.  Pes planus foot type.  Muscular strength 5/5 in all lower extremity muscular groups bilateral without pain on range of motion.  Assessment and Plan: Problem List Items Addressed This Visit       Other   Intellectual disability   Other Visit Diagnoses     Pain due to onychomycosis of nail    -  Primary   Callus        DM type 2 with diabetic peripheral neuropathy ([place])            -Examined patient. -Discussed and educated patient on diabetic foot care, especially with  regards to the vascular, neurological and musculoskeletal systems.  -Stressed the importance of good glycemic control and the detriment of not  controlling glucose levels in relation to the foot. -Mechanically debrided all painful nails 1-5 bilateral using sterile nail nipper and filed with dremel without incident  -Mechanically debrided all painful keratoses x2 on the right foot plantar third and fifth metatarsal heads using a sterile 15 blade without incident -Advised good supportive shoes daily for foot type -Answered all patient questions -Patient to return  in 3 months for at risk foot care  -Patient's sister who is also her caregiver advised to call the office if any problems or questions arise in the meantime.  Lorenda Peck, DPM

## 2021-08-26 ENCOUNTER — Ambulatory Visit: Payer: Medicare Other | Admitting: Podiatry

## 2021-08-27 DIAGNOSIS — Z3042 Encounter for surveillance of injectable contraceptive: Secondary | ICD-10-CM | POA: Diagnosis not present

## 2021-09-06 DIAGNOSIS — J310 Chronic rhinitis: Secondary | ICD-10-CM | POA: Diagnosis not present

## 2021-09-06 DIAGNOSIS — J3489 Other specified disorders of nose and nasal sinuses: Secondary | ICD-10-CM | POA: Diagnosis not present

## 2021-09-06 DIAGNOSIS — H6123 Impacted cerumen, bilateral: Secondary | ICD-10-CM | POA: Diagnosis not present

## 2021-09-21 DIAGNOSIS — G43009 Migraine without aura, not intractable, without status migrainosus: Secondary | ICD-10-CM | POA: Diagnosis not present

## 2021-09-21 DIAGNOSIS — E785 Hyperlipidemia, unspecified: Secondary | ICD-10-CM | POA: Diagnosis not present

## 2021-09-21 DIAGNOSIS — I129 Hypertensive chronic kidney disease with stage 1 through stage 4 chronic kidney disease, or unspecified chronic kidney disease: Secondary | ICD-10-CM | POA: Diagnosis not present

## 2021-09-21 DIAGNOSIS — E538 Deficiency of other specified B group vitamins: Secondary | ICD-10-CM | POA: Diagnosis not present

## 2021-09-21 DIAGNOSIS — Z794 Long term (current) use of insulin: Secondary | ICD-10-CM | POA: Diagnosis not present

## 2021-09-21 DIAGNOSIS — E669 Obesity, unspecified: Secondary | ICD-10-CM | POA: Diagnosis not present

## 2021-09-21 DIAGNOSIS — I10 Essential (primary) hypertension: Secondary | ICD-10-CM | POA: Diagnosis not present

## 2021-09-21 DIAGNOSIS — E1129 Type 2 diabetes mellitus with other diabetic kidney complication: Secondary | ICD-10-CM | POA: Diagnosis not present

## 2021-09-21 DIAGNOSIS — F418 Other specified anxiety disorders: Secondary | ICD-10-CM | POA: Diagnosis not present

## 2021-09-21 DIAGNOSIS — F79 Unspecified intellectual disabilities: Secondary | ICD-10-CM | POA: Diagnosis not present

## 2021-09-21 DIAGNOSIS — R251 Tremor, unspecified: Secondary | ICD-10-CM | POA: Diagnosis not present

## 2021-09-21 DIAGNOSIS — N1831 Chronic kidney disease, stage 3a: Secondary | ICD-10-CM | POA: Diagnosis not present

## 2021-11-01 ENCOUNTER — Ambulatory Visit (INDEPENDENT_AMBULATORY_CARE_PROVIDER_SITE_OTHER): Payer: Self-pay | Admitting: Podiatry

## 2021-11-01 DIAGNOSIS — Z91199 Patient's noncompliance with other medical treatment and regimen due to unspecified reason: Secondary | ICD-10-CM

## 2021-11-01 NOTE — Progress Notes (Signed)
No show

## 2021-11-12 DIAGNOSIS — Z3042 Encounter for surveillance of injectable contraceptive: Secondary | ICD-10-CM | POA: Diagnosis not present

## 2021-12-02 ENCOUNTER — Ambulatory Visit (INDEPENDENT_AMBULATORY_CARE_PROVIDER_SITE_OTHER): Payer: Medicare Other | Admitting: Podiatry

## 2021-12-02 ENCOUNTER — Encounter: Payer: Self-pay | Admitting: Podiatry

## 2021-12-02 DIAGNOSIS — L84 Corns and callosities: Secondary | ICD-10-CM | POA: Diagnosis not present

## 2021-12-02 DIAGNOSIS — M2142 Flat foot [pes planus] (acquired), left foot: Secondary | ICD-10-CM | POA: Diagnosis not present

## 2021-12-02 DIAGNOSIS — M2141 Flat foot [pes planus] (acquired), right foot: Secondary | ICD-10-CM

## 2021-12-02 DIAGNOSIS — E1142 Type 2 diabetes mellitus with diabetic polyneuropathy: Secondary | ICD-10-CM | POA: Diagnosis not present

## 2021-12-02 DIAGNOSIS — M2042 Other hammer toe(s) (acquired), left foot: Secondary | ICD-10-CM | POA: Diagnosis not present

## 2021-12-02 DIAGNOSIS — M2041 Other hammer toe(s) (acquired), right foot: Secondary | ICD-10-CM | POA: Diagnosis not present

## 2021-12-02 DIAGNOSIS — M79609 Pain in unspecified limb: Secondary | ICD-10-CM

## 2021-12-02 DIAGNOSIS — E119 Type 2 diabetes mellitus without complications: Secondary | ICD-10-CM | POA: Diagnosis not present

## 2021-12-02 DIAGNOSIS — B351 Tinea unguium: Secondary | ICD-10-CM

## 2021-12-02 NOTE — Progress Notes (Signed)
ANNUAL DIABETIC FOOT EXAM  Subjective: Sara Chung presents today for annual diabetic foot examination.  Patient has h/o intellectual disability and is accompanied by her mother on today's visit. Mother confirms h/o diabetes.  Mother denies any h/o foot wounds.  Patient denies any numbness, tingling, burning, or pins/needle sensation in feet.  Patient's blood sugar was 138 mg/dl today. Last known  HgA1c was 8.0%   Risk factors: diabetes.  Sara Sheriff, MD is patient's PCP. Last visit was early 2023.  Past Medical History:  Diagnosis Date   Diabetes mellitus without complication [facility])    Scoliosis    Patient Active Problem List   Diagnosis Date Noted   Intellectual disability 05/28/2019   History reviewed. No pertinent surgical history. Current Outpatient Medications on File Prior to Visit  Medication Sig Dispense Refill   BD PEN NEEDLE NANO U/F 32G X 4 MM MISC U QID UTD     Canagliflozin (INVOKANA) 300 MG TABS Take by mouth.     carisoprodol (SOMA) 350 MG tablet   2   CRESTOR 10 MG tablet   6   divalproex (DEPAKOTE) 250 MG DR tablet Take by mouth.     doxycycline (VIBRA-TABS) 100 MG tablet Take 1 capsule by mouth 2 (two) times Sara.     escitalopram (LEXAPRO) 10 MG tablet TK 1 T PO QD     estradiol cypionate (DEPO-ESTRADIOL) 5 MG/ML injection Inject into the muscle every 28 (twenty-eight) days.     glimepiride (AMARYL) 2 MG tablet   6   glipiZIDE (GLUCOTROL) 10 MG tablet Take 10 mg by mouth Sara before breakfast.     glucose blood (PRECISION QID TEST) test strip OneTouch Ultra Test strips  USE TO CHECK BLOOD SUGAR QID     insulin glargine (LANTUS) 100 UNIT/ML injection Inject into the skin at bedtime.     LANTUS SOLOSTAR 100 UNIT/ML Solostar Pen   5   lisinopril (ZESTRIL) 5 MG tablet Take 5 mg by mouth Sara.     medroxyPROGESTERone (DEPO-PROVERA) 150 MG/ML injection ADMINISTER 1 ML(150 MG) IN THE MUSCLE EVERY 3 MONTHS     metFORMIN (GLUCOPHAGE) 500 MG  tablet Take by mouth 2 (two) times Sara with a meal.     montelukast (SINGULAIR) 10 MG tablet TK 1 T PO QD     mupirocin ointment (BACTROBAN) 2 % APPLY TO BOTH NOSTRILS TWICE A DAY FOR 5 DAYS     nitrofurantoin, macrocrystal-monohydrate, (MACROBID) 100 MG capsule      NOVOLOG FLEXPEN 100 UNIT/ML FlexPen INJ 8 UNITS [place] WITH EACH MEAL     pioglitazone-metformin (ACTOPLUS MET) 15-850 MG per tablet   5   topiramate (TOPAMAX) 25 MG tablet   5   trimethoprim-polymyxin b (POLYTRIM) ophthalmic solution SMARTSIG:In Eye(s)     valACYclovir (VALTREX) 1000 MG tablet   0   valACYclovir (VALTREX) 500 MG tablet Take by mouth.     valACYclovir (VALTREX) 500 MG tablet Take by mouth.     venlafaxine (EFFEXOR) 75 MG tablet Take 1 tablet by mouth Sara.     ZOVIRAX 5 %   6   No current facility-administered medications on file prior to visit.    Allergies  Allergen Reactions   Canagliflozin     Other reaction(s): Other (See Comments) Dysuria and vaganitis   Latex    Social History   Occupational History   Not on file  Tobacco Use   Smoking status: Never   Smokeless tobacco: Never  Substance and  Sexual Activity   Alcohol use: No   Drug use: No   Sexual activity: Not on file   Family History  Problem Relation Age of Onset   Breast cancer Sister    Breast cancer Mother     There is no immunization history on file for this patient.   Review of Systems: Negative except as noted in the HPI.   Objective: There were no vitals filed for this visit.  Sara Chung is a pleasant 46 y.o. female in NAD. AAO X 3.  Vascular Examination: Capillary refill time immediate b/l.Vascular status intact b/l with palpable pedal pulses. Pedal hair present b/l. No edema. No pain with calf compression b/l. Skin temperature gradient WNL b/l. No edema noted b/l LE.  Neurological Examination: Sensation grossly intact b/l with 10 gram monofilament. Vibratory sensation intact b/l.   Dermatological  Examination: Pedal skin with normal turgor, texture and tone b/l. Toenails 1-5 b/l thick, discolored, elongated with subungual debris and pain on dorsal palpation. Hyperkeratotic lesion(s) submet head 5 b/l.  No erythema, no edema, no drainage, no fluctuance.  Musculoskeletal Examination: Normal muscle strength 5/5 to all lower extremity muscle groups bilaterally. Hammertoe deformity noted 2-5 b/l. Pes planus deformity noted bilateral LE.Marland Kitchen No pain, crepitus or joint limitation noted with ROM b/l LE.  Patient ambulates independently without assistive aids.  Radiographs: None  Footwear Assessment: Does the patient wear appropriate shoes? Yes. Does the patient need inserts/orthotics? No.  ADA Risk Categorization: Low Risk :  Patient has all of the following: Intact protective sensation No prior foot ulcer  No severe deformity Pedal pulses present  Assessment: 1. Pain due to onychomycosis of nail   2. Callus   3. Pes planus of both feet   4. Acquired hammertoes of both feet   5. DM type 2 with diabetic peripheral neuropathy ([place])   6. Encounter for diabetic foot exam [facility])     Plan: -Patient's family member present. All questions/concerns addressed on today's visit. -Patient to continue soft, supportive shoe gear Sara. -Toenails 1-5 b/l were debrided in length and girth with sterile nail nippers and dremel without iatrogenic bleeding.  -Callus(es) submet head 5 b/l pared utilizing sterile scalpel blade without complication or incident. Total number debrided =2. -Patient/POA to call should there be question/concern in the interim. Return in about 3 months (around 03/03/2022).  Marzetta Board, DPM

## 2022-01-06 DIAGNOSIS — J301 Allergic rhinitis due to pollen: Secondary | ICD-10-CM | POA: Diagnosis not present

## 2022-01-06 DIAGNOSIS — Z6834 Body mass index (BMI) 34.0-34.9, adult: Secondary | ICD-10-CM | POA: Diagnosis not present

## 2022-01-06 DIAGNOSIS — Z23 Encounter for immunization: Secondary | ICD-10-CM | POA: Diagnosis not present

## 2022-01-06 DIAGNOSIS — F79 Unspecified intellectual disabilities: Secondary | ICD-10-CM | POA: Diagnosis not present

## 2022-01-21 DIAGNOSIS — E1129 Type 2 diabetes mellitus with other diabetic kidney complication: Secondary | ICD-10-CM | POA: Diagnosis not present

## 2022-01-21 DIAGNOSIS — I129 Hypertensive chronic kidney disease with stage 1 through stage 4 chronic kidney disease, or unspecified chronic kidney disease: Secondary | ICD-10-CM | POA: Diagnosis not present

## 2022-01-21 DIAGNOSIS — E669 Obesity, unspecified: Secondary | ICD-10-CM | POA: Diagnosis not present

## 2022-01-21 DIAGNOSIS — N200 Calculus of kidney: Secondary | ICD-10-CM | POA: Diagnosis not present

## 2022-01-21 DIAGNOSIS — N1831 Chronic kidney disease, stage 3a: Secondary | ICD-10-CM | POA: Diagnosis not present

## 2022-01-21 DIAGNOSIS — R251 Tremor, unspecified: Secondary | ICD-10-CM | POA: Diagnosis not present

## 2022-01-21 DIAGNOSIS — Z794 Long term (current) use of insulin: Secondary | ICD-10-CM | POA: Diagnosis not present

## 2022-01-21 DIAGNOSIS — G43009 Migraine without aura, not intractable, without status migrainosus: Secondary | ICD-10-CM | POA: Diagnosis not present

## 2022-01-21 DIAGNOSIS — L84 Corns and callosities: Secondary | ICD-10-CM | POA: Diagnosis not present

## 2022-01-21 DIAGNOSIS — E538 Deficiency of other specified B group vitamins: Secondary | ICD-10-CM | POA: Diagnosis not present

## 2022-01-21 DIAGNOSIS — E785 Hyperlipidemia, unspecified: Secondary | ICD-10-CM | POA: Diagnosis not present

## 2022-01-31 DIAGNOSIS — Z3042 Encounter for surveillance of injectable contraceptive: Secondary | ICD-10-CM | POA: Diagnosis not present

## 2022-03-24 ENCOUNTER — Ambulatory Visit (INDEPENDENT_AMBULATORY_CARE_PROVIDER_SITE_OTHER): Payer: Medicare Other | Admitting: Podiatry

## 2022-03-24 VITALS — BP 98/69 | HR 95

## 2022-03-24 DIAGNOSIS — L84 Corns and callosities: Secondary | ICD-10-CM | POA: Diagnosis not present

## 2022-03-24 DIAGNOSIS — E1142 Type 2 diabetes mellitus with diabetic polyneuropathy: Secondary | ICD-10-CM

## 2022-03-24 DIAGNOSIS — B351 Tinea unguium: Secondary | ICD-10-CM | POA: Diagnosis not present

## 2022-03-24 DIAGNOSIS — M79609 Pain in unspecified limb: Secondary | ICD-10-CM

## 2022-03-24 NOTE — Progress Notes (Signed)
  Subjective:  Patient ID: Sara Chung, female    DOB: [birth year],  MRN: [id number]  Sara Chung presents to clinic today for callus(es) right foot and painful thick toenails that are difficult to trim. Painful toenails interfere with ambulation. Aggravating factors include wearing enclosed shoe gear. Pain is relieved with periodic professional debridement. Painful calluses are aggravated when weightbearing with and without shoegear. Pain is relieved with periodic professional debridement.  Chief Complaint  Patient presents with   Nail Problem    Routine foot care, Nail trim, last seen PCP a month ago A1c-  unknown BG-120   New problem(s): None.   Patient is accompanied by her mother on today's visit.  PCP is Angelina Sheriff, MD.  Allergies  Allergen Reactions   Canagliflozin     Other reaction(s): Other (See Comments) Dysuria and vaganitis   Latex    Review of Systems: Negative except as noted in the HPI.  Objective:  Vitals:   03/24/22 1340  BP: 98/69  Pulse: 95   Sara Chung is a pleasant 47 y.o. female obese in NAD. AAO x 3.  Vascular Examination: Capillary refill time immediate b/l.Vascular status intact b/l with palpable pedal pulses. Pedal hair present b/l. No edema. No pain with calf compression b/l. Skin temperature gradient WNL b/l. No edema noted b/l LE.  Neurological Examination: Sensation grossly intact b/l with 10 gram monofilament. Vibratory sensation intact b/l.   Dermatological Examination: Pedal skin with normal turgor, texture and tone b/l. Toenails 1-5 b/l thick, discolored, elongated with subungual debris and pain on dorsal palpation.   Hyperkeratotic lesion(s) submet head 5 right foot.  No erythema, no edema, no drainage, no fluctuance.  Musculoskeletal Examination: Normal muscle strength 5/5 to all lower extremity muscle groups bilaterally. Hammertoe deformity noted 2-5 b/l. Pes planus deformity noted bilateral LE. No pain,  crepitus or joint limitation noted with ROM b/l LE.  Patient ambulates independently without assistive aids.  Radiographs: None  Assessment/Plan: 1. Pain due to onychomycosis of nail   2. Callus   3. DM type 2 with diabetic peripheral neuropathy (HCC)     -Patient's family member present. All questions/concerns addressed on today's visit. -Consent given for treatment as described below: -Continue foot and shoe inspections daily. Monitor blood glucose per PCP/Endocrinologist's recommendations. -Continue supportive shoe gear daily. -Mycotic toenails 1-5 bilaterally were debrided in length and girth with sterile nail nippers and dremel without incident. -Callus(es) submet head 5 right foot pared utilizing sterile scalpel blade without complication or incident. Total number debrided =1. -Patient/POA to call should there be question/concern in the interim.   Return in about 3 months (around 06/23/2022).  Marzetta Board, DPM

## 2022-03-28 ENCOUNTER — Encounter: Payer: Self-pay | Admitting: Podiatry

## 2022-03-31 DIAGNOSIS — E785 Hyperlipidemia, unspecified: Secondary | ICD-10-CM | POA: Diagnosis not present

## 2022-03-31 DIAGNOSIS — E1129 Type 2 diabetes mellitus with other diabetic kidney complication: Secondary | ICD-10-CM | POA: Diagnosis not present

## 2022-03-31 DIAGNOSIS — N1831 Chronic kidney disease, stage 3a: Secondary | ICD-10-CM | POA: Diagnosis not present

## 2022-03-31 DIAGNOSIS — I129 Hypertensive chronic kidney disease with stage 1 through stage 4 chronic kidney disease, or unspecified chronic kidney disease: Secondary | ICD-10-CM | POA: Diagnosis not present

## 2022-03-31 DIAGNOSIS — Z794 Long term (current) use of insulin: Secondary | ICD-10-CM | POA: Diagnosis not present

## 2022-04-13 DIAGNOSIS — E1122 Type 2 diabetes mellitus with diabetic chronic kidney disease: Secondary | ICD-10-CM | POA: Diagnosis not present

## 2022-04-13 DIAGNOSIS — H6090 Unspecified otitis externa, unspecified ear: Secondary | ICD-10-CM | POA: Diagnosis not present

## 2022-04-13 DIAGNOSIS — N1832 Chronic kidney disease, stage 3b: Secondary | ICD-10-CM | POA: Diagnosis not present

## 2022-04-13 DIAGNOSIS — I129 Hypertensive chronic kidney disease with stage 1 through stage 4 chronic kidney disease, or unspecified chronic kidney disease: Secondary | ICD-10-CM | POA: Diagnosis not present

## 2022-04-13 DIAGNOSIS — E785 Hyperlipidemia, unspecified: Secondary | ICD-10-CM | POA: Diagnosis not present

## 2022-04-13 DIAGNOSIS — N390 Urinary tract infection, site not specified: Secondary | ICD-10-CM | POA: Diagnosis not present

## 2022-05-24 DIAGNOSIS — L84 Corns and callosities: Secondary | ICD-10-CM | POA: Diagnosis not present

## 2022-05-24 DIAGNOSIS — I129 Hypertensive chronic kidney disease with stage 1 through stage 4 chronic kidney disease, or unspecified chronic kidney disease: Secondary | ICD-10-CM | POA: Diagnosis not present

## 2022-05-24 DIAGNOSIS — F79 Unspecified intellectual disabilities: Secondary | ICD-10-CM | POA: Diagnosis not present

## 2022-05-24 DIAGNOSIS — E1129 Type 2 diabetes mellitus with other diabetic kidney complication: Secondary | ICD-10-CM | POA: Diagnosis not present

## 2022-05-24 DIAGNOSIS — F418 Other specified anxiety disorders: Secondary | ICD-10-CM | POA: Diagnosis not present

## 2022-05-24 DIAGNOSIS — R251 Tremor, unspecified: Secondary | ICD-10-CM | POA: Diagnosis not present

## 2022-05-24 DIAGNOSIS — G43009 Migraine without aura, not intractable, without status migrainosus: Secondary | ICD-10-CM | POA: Diagnosis not present

## 2022-05-24 DIAGNOSIS — E669 Obesity, unspecified: Secondary | ICD-10-CM | POA: Diagnosis not present

## 2022-05-24 DIAGNOSIS — N200 Calculus of kidney: Secondary | ICD-10-CM | POA: Diagnosis not present

## 2022-05-24 DIAGNOSIS — E785 Hyperlipidemia, unspecified: Secondary | ICD-10-CM | POA: Diagnosis not present

## 2022-05-24 DIAGNOSIS — Z794 Long term (current) use of insulin: Secondary | ICD-10-CM | POA: Diagnosis not present

## 2022-05-24 DIAGNOSIS — N1831 Chronic kidney disease, stage 3a: Secondary | ICD-10-CM | POA: Diagnosis not present

## 2022-06-16 ENCOUNTER — Ambulatory Visit: Payer: Medicare Other | Admitting: Podiatry

## 2022-07-12 DIAGNOSIS — Z3042 Encounter for surveillance of injectable contraceptive: Secondary | ICD-10-CM | POA: Diagnosis not present

## 2022-07-13 ENCOUNTER — Ambulatory Visit (INDEPENDENT_AMBULATORY_CARE_PROVIDER_SITE_OTHER): Payer: Medicare Other | Admitting: Podiatry

## 2022-07-13 DIAGNOSIS — B351 Tinea unguium: Secondary | ICD-10-CM

## 2022-07-13 DIAGNOSIS — Z794 Long term (current) use of insulin: Secondary | ICD-10-CM

## 2022-07-13 DIAGNOSIS — N1831 Chronic kidney disease, stage 3a: Secondary | ICD-10-CM | POA: Diagnosis not present

## 2022-07-13 DIAGNOSIS — E1122 Type 2 diabetes mellitus with diabetic chronic kidney disease: Secondary | ICD-10-CM | POA: Diagnosis not present

## 2022-07-13 DIAGNOSIS — Q66222 Congenital metatarsus adductus, left foot: Secondary | ICD-10-CM

## 2022-07-13 DIAGNOSIS — Q66221 Congenital metatarsus adductus, right foot: Secondary | ICD-10-CM

## 2022-07-13 DIAGNOSIS — M79609 Pain in unspecified limb: Secondary | ICD-10-CM

## 2022-07-13 NOTE — Progress Notes (Signed)
Chief Complaint  Patient presents with   Nail Problem    Diabetic Foot Care   PCP: Gwendlyn Deutscher, MD Last Visit:  Latest A1C: 8.0 (March 2024)    HPI: 47 y.o. female presenting today with her mother for concern of painful thickened elongated nails that hurt in shoe gear when trying to ambulate.  Her mother also noted that there is a painful skin lesion on the bottom of the right forefoot.  The patient is currently wearing sneakers.  Foot Pain This is a chronic problem. The current episode started more than 1 year ago. The problem occurs constantly. The problem has been gradually worsening. The symptoms are aggravated by standing and walking. She has tried nothing for the symptoms.     Past Medical History:  Diagnosis Date   Diabetes mellitus without complication (HCC)    Scoliosis     No past surgical history on file.  Allergies  Allergen Reactions   Canagliflozin     Other reaction(s): Other (See Comments) Dysuria and vaganitis   Latex     Review of Systems  Musculoskeletal:        Pain on bottom of right foot near 5th toe  Skin:        Painful, elongated toenails b/l      Physical Exam:  General: The patient is alert and oriented x3 in no acute distress.  Dermatology: Skin is warm, dry and supple bilateral lower extremities. Interspaces are clear of maceration and debris.  Toenails are 3 mm thick with light yellow discoloration subungual debris and distal onycholysis.  There is pain with compression of the nails.  There is a hyperkeratotic lesion submet 5 right foot with pain on palpation.  There is no surrounding erythema and no ulceration noted.  Vascular: Palpable pedal pulses bilaterally. Capillary refill within normal limits.  No appreciable edema.  No erythema or calor.  Neurological: Light touch sensation grossly intact bilateral feet.   Musculoskeletal Exam: The patient has a C shaped foot bilateral.  During gait the patient continues to place more  pressure towards the lateral forefoot at pushoff.  This is due to the C shape of the foot which is consistent with metatarsus adductus deformity bilateral.  Assessment/Plan of Care: 1. Congenital metatarsus adductus of both feet   2. Pain due to onychomycosis of nail     Discussed with the patient's foot type with the patient and her mother today.  Noted that her bone structure of her feet as well as her style of gait contributed to the increased pressure near the fifth metatarsal head which can result in callus formation.  Orthotics with a lateral wedge and fifth met head cut out could alleviate any discomfort if they would like to proceed with this.  She could be covered for diabetic shoes with custom diabetic inserts, so will contact our proper department to check into this for the patient and reach out to them for shoe consult.  Could not adjust her current shoe inserts today as they are glued into the shoes.  Therefore a felt offloading pad was placed onto the foot and 1 extra 1 was provided to the patient's mother to use in the future.  The mycotic nails were debrided x 10 with sterile nail nippers and a power debriding bur.  The hyperkeratotic lesion was shaved with a sterile #312 blade uneventfully.  Patient tolerated this well.  Follow-up in 3 months for nail care.   Gila Lauf D.  [facility] General, DPM, FACFAS [facility]     [address] [place], [place] [address]                Office [phone]  Fax [phone]

## 2022-07-19 DIAGNOSIS — M7918 Myalgia, other site: Secondary | ICD-10-CM | POA: Diagnosis not present

## 2022-08-16 DIAGNOSIS — F79 Unspecified intellectual disabilities: Secondary | ICD-10-CM | POA: Diagnosis not present

## 2022-08-16 DIAGNOSIS — Z01419 Encounter for gynecological examination (general) (routine) without abnormal findings: Secondary | ICD-10-CM | POA: Diagnosis not present

## 2022-10-07 DIAGNOSIS — Z3042 Encounter for surveillance of injectable contraceptive: Secondary | ICD-10-CM | POA: Diagnosis not present

## 2022-10-12 ENCOUNTER — Ambulatory Visit: Payer: Medicare Other | Admitting: Podiatry

## 2022-10-12 DIAGNOSIS — B351 Tinea unguium: Secondary | ICD-10-CM

## 2022-10-12 DIAGNOSIS — M79676 Pain in unspecified toe(s): Secondary | ICD-10-CM | POA: Diagnosis not present

## 2022-10-12 DIAGNOSIS — M79609 Pain in unspecified limb: Secondary | ICD-10-CM

## 2022-10-12 DIAGNOSIS — L84 Corns and callosities: Secondary | ICD-10-CM

## 2022-10-12 NOTE — Progress Notes (Signed)
       Subjective:  Patient ID: Sara Chung, female    DOB: 1975-11-12,  MRN: [id number]  [name] presents to clinic today for:  Chief Complaint  Patient presents with   Nail Problem    Routine Foot Care-nail trim    Callouses    Callus trim to right foot.    Patient notes nails are thick, discolored, elongated and painful in shoegear when trying to ambulate.  She also has painful calluses bilateral submet 1  PCP is Noni Saupe, MD.  Allergies  Allergen Reactions   Canagliflozin     Other reaction(s): Other (See Comments) Dysuria and vaganitis   Latex     Review of Systems: Negative except as noted in the HPI.  Objective:  There were no vitals filed for this visit.  Sara Chung is a pleasant 47 y.o. female in NAD. AAO x 3.  Vascular Examination: Capillary refill time is 3-5 seconds to toes bilateral. Palpable pedal pulses b/l LE. Digital hair present b/l. No pedal edema b/l. Skin temperature gradient WNL b/l. No varicosities b/l. No cyanosis or clubbing noted b/l.   Dermatological Examination: Pedal skin with normal turgor, texture and tone b/l. No open wounds. No interdigital macerations b/l. Toenails x10 are 3mm thick, discolored, dystrophic with subungual debris. There is pain with compression of the nail plates.  They are elongated x10.  There are calluses bilateral submet 1.  Assessment/Plan: 1. Pain due to onychomycosis of nail   2. Callus     The mycotic toenails were sharply debrided x10 with sterile nail nippers and a power debriding burr to decrease bulk/thickness and length.    The calluses were shaved x 2 with a sterile #313 blade.  Follow-up 3 months or as needed   Clerance Lav, DPM, FACFAS [facility]     [address] [place], [place] [address]                Office [phone]  Fax [phone]

## 2022-10-18 DIAGNOSIS — E785 Hyperlipidemia, unspecified: Secondary | ICD-10-CM | POA: Diagnosis not present

## 2022-10-18 DIAGNOSIS — E538 Deficiency of other specified B group vitamins: Secondary | ICD-10-CM | POA: Diagnosis not present

## 2022-10-18 DIAGNOSIS — N200 Calculus of kidney: Secondary | ICD-10-CM | POA: Diagnosis not present

## 2022-10-18 DIAGNOSIS — F79 Unspecified intellectual disabilities: Secondary | ICD-10-CM | POA: Diagnosis not present

## 2022-10-18 DIAGNOSIS — F418 Other specified anxiety disorders: Secondary | ICD-10-CM | POA: Diagnosis not present

## 2022-10-18 DIAGNOSIS — I129 Hypertensive chronic kidney disease with stage 1 through stage 4 chronic kidney disease, or unspecified chronic kidney disease: Secondary | ICD-10-CM | POA: Diagnosis not present

## 2022-10-18 DIAGNOSIS — E1129 Type 2 diabetes mellitus with other diabetic kidney complication: Secondary | ICD-10-CM | POA: Diagnosis not present

## 2022-10-18 DIAGNOSIS — M2550 Pain in unspecified joint: Secondary | ICD-10-CM | POA: Diagnosis not present

## 2022-10-18 DIAGNOSIS — Z794 Long term (current) use of insulin: Secondary | ICD-10-CM | POA: Diagnosis not present

## 2022-10-18 DIAGNOSIS — E669 Obesity, unspecified: Secondary | ICD-10-CM | POA: Diagnosis not present

## 2022-10-18 DIAGNOSIS — G43009 Migraine without aura, not intractable, without status migrainosus: Secondary | ICD-10-CM | POA: Diagnosis not present

## 2022-10-18 DIAGNOSIS — N1831 Chronic kidney disease, stage 3a: Secondary | ICD-10-CM | POA: Diagnosis not present

## 2022-12-06 DIAGNOSIS — N1832 Chronic kidney disease, stage 3b: Secondary | ICD-10-CM | POA: Diagnosis not present

## 2022-12-06 DIAGNOSIS — M79606 Pain in leg, unspecified: Secondary | ICD-10-CM | POA: Diagnosis not present

## 2022-12-06 DIAGNOSIS — I129 Hypertensive chronic kidney disease with stage 1 through stage 4 chronic kidney disease, or unspecified chronic kidney disease: Secondary | ICD-10-CM | POA: Diagnosis not present

## 2022-12-06 DIAGNOSIS — Z794 Long term (current) use of insulin: Secondary | ICD-10-CM | POA: Diagnosis not present

## 2022-12-06 DIAGNOSIS — E785 Hyperlipidemia, unspecified: Secondary | ICD-10-CM | POA: Diagnosis not present

## 2022-12-06 DIAGNOSIS — Z23 Encounter for immunization: Secondary | ICD-10-CM | POA: Diagnosis not present

## 2022-12-06 DIAGNOSIS — E1122 Type 2 diabetes mellitus with diabetic chronic kidney disease: Secondary | ICD-10-CM | POA: Diagnosis not present

## 2022-12-06 DIAGNOSIS — E1129 Type 2 diabetes mellitus with other diabetic kidney complication: Secondary | ICD-10-CM | POA: Diagnosis not present

## 2022-12-06 DIAGNOSIS — N390 Urinary tract infection, site not specified: Secondary | ICD-10-CM | POA: Diagnosis not present

## 2022-12-06 DIAGNOSIS — N1831 Chronic kidney disease, stage 3a: Secondary | ICD-10-CM | POA: Diagnosis not present

## 2023-01-05 DIAGNOSIS — Z3042 Encounter for surveillance of injectable contraceptive: Secondary | ICD-10-CM | POA: Diagnosis not present

## 2023-01-12 ENCOUNTER — Ambulatory Visit (INDEPENDENT_AMBULATORY_CARE_PROVIDER_SITE_OTHER): Payer: Medicare Other | Admitting: Podiatry

## 2023-01-12 DIAGNOSIS — L84 Corns and callosities: Secondary | ICD-10-CM | POA: Diagnosis not present

## 2023-01-12 DIAGNOSIS — M79674 Pain in right toe(s): Secondary | ICD-10-CM | POA: Diagnosis not present

## 2023-01-12 DIAGNOSIS — M79675 Pain in left toe(s): Secondary | ICD-10-CM

## 2023-01-12 DIAGNOSIS — B351 Tinea unguium: Secondary | ICD-10-CM | POA: Diagnosis not present

## 2023-01-12 DIAGNOSIS — E1142 Type 2 diabetes mellitus with diabetic polyneuropathy: Secondary | ICD-10-CM | POA: Diagnosis not present

## 2023-01-12 DIAGNOSIS — I739 Peripheral vascular disease, unspecified: Secondary | ICD-10-CM

## 2023-01-12 NOTE — Progress Notes (Signed)
       Subjective:  Patient ID: Sara Chung, female    DOB: August 10, 1975,  MRN: [id number]  Sara Chung presents to clinic today for:  Chief Complaint  Patient presents with   Foot Care    Here to have toenails trimmed and callouses shaved. She would also like for provider to look at right heel. Has a callous/prominent area sticking on heel. It is causing pain and is concerning for patient. She has Sara Chung, her sister, with her.    Patient notes nails are thick and elongated, causing pain in shoe gear when ambulating.  She also has painful calluses bilateral especially on the heels.  PCP is Dr. Adrian Chung.  Date last seen was 10/18/2022  Past Medical History:  Diagnosis Date   Diabetes mellitus without complication (HCC)    Scoliosis     Allergies  Allergen Reactions   Canagliflozin     Other reaction(s): Other (See Comments) Dysuria and vaganitis   Latex     Objective:  Sara Chung is a pleasant 46 y.o. female in NAD. AAO x 3.  Vascular Examination: Patient has palpable DP pulse, absent PT pulse bilateral.  Delayed capillary refill bilateral toes.  Sparse digital hair bilateral.  Proximal to distal cooling WNL bilateral.    Dermatological Examination: Interspaces are clear with no open lesions noted bilateral.  Skin is shiny and atrophic bilateral.  Nails are 3-55mm thick, with yellowish/brown discoloration, subungual debris and distal onycholysis x10.  There is pain with compression of nails x10.  There are hyperkeratotic lesions noted bilateral heels, bilateral submet 1.  Assessment/Plan: 1. Pain due to onychomycosis of toenails of both feet   2. DM type 2 with diabetic peripheral neuropathy (HCC)   3. Callus     Mycotic nails x10 were sharply debrided with sterile nail nippers and power debriding burr to decrease bulk and length.  Hyperkeratotic lesions x 4 were shaved with #312 blade.   Return in about 3 months (around 04/14/2023) for RFC.   Sara Chung, DPM, FACFAS [facility]     [address] [place], [place] [address]                Office [phone]  Fax [phone]

## 2023-01-29 DIAGNOSIS — J069 Acute upper respiratory infection, unspecified: Secondary | ICD-10-CM | POA: Diagnosis not present

## 2023-02-22 DIAGNOSIS — R251 Tremor, unspecified: Secondary | ICD-10-CM | POA: Diagnosis not present

## 2023-02-22 DIAGNOSIS — E1129 Type 2 diabetes mellitus with other diabetic kidney complication: Secondary | ICD-10-CM | POA: Diagnosis not present

## 2023-02-22 DIAGNOSIS — E669 Obesity, unspecified: Secondary | ICD-10-CM | POA: Diagnosis not present

## 2023-02-22 DIAGNOSIS — I129 Hypertensive chronic kidney disease with stage 1 through stage 4 chronic kidney disease, or unspecified chronic kidney disease: Secondary | ICD-10-CM | POA: Diagnosis not present

## 2023-02-22 DIAGNOSIS — Z794 Long term (current) use of insulin: Secondary | ICD-10-CM | POA: Diagnosis not present

## 2023-02-22 DIAGNOSIS — M2550 Pain in unspecified joint: Secondary | ICD-10-CM | POA: Diagnosis not present

## 2023-02-22 DIAGNOSIS — E785 Hyperlipidemia, unspecified: Secondary | ICD-10-CM | POA: Diagnosis not present

## 2023-02-22 DIAGNOSIS — F79 Unspecified intellectual disabilities: Secondary | ICD-10-CM | POA: Diagnosis not present

## 2023-02-22 DIAGNOSIS — E538 Deficiency of other specified B group vitamins: Secondary | ICD-10-CM | POA: Diagnosis not present

## 2023-02-22 DIAGNOSIS — M79606 Pain in leg, unspecified: Secondary | ICD-10-CM | POA: Diagnosis not present

## 2023-02-22 DIAGNOSIS — G43009 Migraine without aura, not intractable, without status migrainosus: Secondary | ICD-10-CM | POA: Diagnosis not present

## 2023-02-22 DIAGNOSIS — N1831 Chronic kidney disease, stage 3a: Secondary | ICD-10-CM | POA: Diagnosis not present

## 2023-02-23 ENCOUNTER — Other Ambulatory Visit: Payer: Self-pay | Admitting: Endocrinology

## 2023-02-23 DIAGNOSIS — M79606 Pain in leg, unspecified: Secondary | ICD-10-CM

## 2023-03-10 ENCOUNTER — Other Ambulatory Visit (HOSPITAL_COMMUNITY): Payer: Self-pay | Admitting: Endocrinology

## 2023-03-10 DIAGNOSIS — M79606 Pain in leg, unspecified: Secondary | ICD-10-CM

## 2023-03-13 ENCOUNTER — Ambulatory Visit (HOSPITAL_COMMUNITY): Admission: RE | Admit: 2023-03-13 | Payer: Medicare Other | Source: Ambulatory Visit

## 2023-03-13 ENCOUNTER — Encounter (HOSPITAL_COMMUNITY): Payer: Self-pay

## 2023-03-20 ENCOUNTER — Ambulatory Visit (HOSPITAL_COMMUNITY)
Admission: RE | Admit: 2023-03-20 | Discharge: 2023-03-20 | Disposition: A | Discharge status: Home / Self Care | Payer: Medicare Other | Source: Ambulatory Visit | Attending: Endocrinology | Admitting: Endocrinology

## 2023-03-20 DIAGNOSIS — M79606 Pain in leg, unspecified: Secondary | ICD-10-CM | POA: Insufficient documentation

## 2023-03-20 LAB — VAS US ABI WITH/WO TBI
Left ABI: 1.04
Right ABI: 1.06

## 2023-03-31 ENCOUNTER — Ambulatory Visit (INDEPENDENT_AMBULATORY_CARE_PROVIDER_SITE_OTHER): Payer: Medicare Other | Admitting: Podiatry

## 2023-03-31 DIAGNOSIS — E119 Type 2 diabetes mellitus without complications: Secondary | ICD-10-CM | POA: Diagnosis not present

## 2023-03-31 DIAGNOSIS — E1142 Type 2 diabetes mellitus with diabetic polyneuropathy: Secondary | ICD-10-CM

## 2023-03-31 DIAGNOSIS — M79674 Pain in right toe(s): Secondary | ICD-10-CM

## 2023-03-31 DIAGNOSIS — B351 Tinea unguium: Secondary | ICD-10-CM | POA: Diagnosis not present

## 2023-03-31 DIAGNOSIS — M79675 Pain in left toe(s): Secondary | ICD-10-CM | POA: Diagnosis not present

## 2023-03-31 DIAGNOSIS — L84 Corns and callosities: Secondary | ICD-10-CM | POA: Diagnosis not present

## 2023-03-31 NOTE — Progress Notes (Unsigned)
    Subjective:  Patient ID: Sara Chung, female    DOB: 06/07/1975,  MRN: [id number]  Sara Chung presents to clinic today for:  Chief Complaint  Patient presents with   Callouses    Rt callous. 2 places, she has been using a callous pad but does not help much. Wants nail care if time, but it may be too early.  A1c 8.8    Patient notes nails are thick and elongated, causing pain in shoe gear when ambulating.  She has a painful callus right submet 5.  This has been causing a lot of pain when walking in her shoes.  The patient is requesting diabetic shoes today.  Patient's mother recently passed away at the end of 2022-04-11 and her sister is now taking care of her.  PCP is Noni Saupe, MD.  Past Medical History:  Diagnosis Date   Diabetes mellitus without complication (HCC)    Scoliosis     Allergies  Allergen Reactions   Canagliflozin     Other reaction(s): Other (See Comments) Dysuria and vaganitis   Latex     Objective:  Sara Chung is a pleasant 48 y.o. female in NAD. AAO x 3.  Vascular Examination: Patient has palpable DP pulse, absent PT pulse bilateral.  Delayed capillary refill bilateral toes.  Sparse digital hair bilateral.  Proximal to distal cooling WNL bilateral.    Dermatological Examination: Interspaces are clear with no open lesions noted bilateral.  Skin is shiny and atrophic bilateral.  Nails are 3-88mm thick, with yellowish/brown discoloration, subungual debris and distal onycholysis x10.  There is pain with compression of nails x10.  There are hyperkeratotic lesions noted right submet 5.  There is no surrounding erythema.  There is pain on palpation of the lesion  Neurological Examination: Protective sensation intact b/l LE. Vibratory sensation diminished b/l LE.  Musculoskeletal Examination: Plantarflexed right fifth metatarsal head.  Patient qualifies for at-risk foot care because of diabetes.  Assessment/Plan: 1. Pain due to  onychomycosis of toenails of both feet   2. Callus   3. DM type 2 with diabetic peripheral neuropathy (HCC)   4. Encounter for diabetic foot exam (HCC)    Mycotic nails x10 were sharply debrided with sterile nail nippers and power debriding burr to decrease bulk and length.  Hyperkeratotic lesion right submet 5 was shaved with #312 blade.  Discussed the diabetic shoe program with the patient and her sister today.  Will get her set up with an appointment for diabetic shoe consult with our pedorthist.  She will need custom diabetic insoles to offload the fifth metatarsal head on the right foot to try to help decrease pain and prevent callus recurrence.   Return in about 3 months (around 06/29/2023) for [facility].   Clerance Lav, DPM, FACFAS [facility]     [address] [place], [place] [address]                Office [phone]  Fax [phone]

## 2023-04-19 ENCOUNTER — Ambulatory Visit: Payer: Medicare Other | Admitting: Podiatry

## 2023-06-13 ENCOUNTER — Ambulatory Visit (INDEPENDENT_AMBULATORY_CARE_PROVIDER_SITE_OTHER): Payer: Medicare Other

## 2023-06-13 DIAGNOSIS — M2041 Other hammer toe(s) (acquired), right foot: Secondary | ICD-10-CM

## 2023-06-13 DIAGNOSIS — E1142 Type 2 diabetes mellitus with diabetic polyneuropathy: Secondary | ICD-10-CM

## 2023-06-13 DIAGNOSIS — I739 Peripheral vascular disease, unspecified: Secondary | ICD-10-CM

## 2023-06-13 DIAGNOSIS — M2141 Flat foot [pes planus] (acquired), right foot: Secondary | ICD-10-CM

## 2023-06-13 NOTE — Progress Notes (Signed)
 Patient presents to the office today for diabetic shoe and insole measuring.  Patient was measured with brannock device to determine size and width for 1 pair of extra depth shoes for 3 pair of insoles.   Documentation of medical necessity will be sent to patient's treating diabetic doctor to verify and sign.   Patient's diabetic provider: Adrian Prince MD   Shoes and insoles will be ordered at that time and patient will be notified for an appointment for fitting when they arrive.   Shoe size (per patient): 8.5 Brannock measurement: 8.5 Shoe choice:   [id number] / Mens version black and white  Shoe size ordered: 8.5WD  Ppw signed

## 2023-06-21 ENCOUNTER — Ambulatory Visit (INDEPENDENT_AMBULATORY_CARE_PROVIDER_SITE_OTHER): Admitting: Podiatry

## 2023-06-21 DIAGNOSIS — B351 Tinea unguium: Secondary | ICD-10-CM | POA: Diagnosis not present

## 2023-06-21 DIAGNOSIS — M79675 Pain in left toe(s): Secondary | ICD-10-CM

## 2023-06-21 DIAGNOSIS — L84 Corns and callosities: Secondary | ICD-10-CM | POA: Diagnosis not present

## 2023-06-21 DIAGNOSIS — M79674 Pain in right toe(s): Secondary | ICD-10-CM | POA: Diagnosis not present

## 2023-06-21 DIAGNOSIS — E1151 Type 2 diabetes mellitus with diabetic peripheral angiopathy without gangrene: Secondary | ICD-10-CM

## 2023-06-21 NOTE — Progress Notes (Signed)
       Subjective:  Patient ID: Sara Chung, female    DOB: 28-Jan-1976,  MRN: [id number]  Sara Chung presents to clinic today for:  Chief Complaint  Patient presents with   [facility]    RFC today with a callous on the right lateral side near the 5th. Last A1c was 8.7 and no anti coag.    Patient notes nails are thick and elongated, causing pain in shoe gear when ambulating.  She also has painful callus right submet 5.  Last A1c was over 8.  PCP is Madelyne Schiff, MD. last seen around 04/26/2023  Past Medical History:  Diagnosis Date   Diabetes mellitus without complication (HCC)    Scoliosis     Allergies  Allergen Reactions   Canagliflozin     Other reaction(s): Other (See Comments) Dysuria and vaganitis   Latex     Objective:  Sara Chung is a pleasant 48 y.o. female in NAD. AAO x 3.  Vascular Examination: Patient has palpable DP pulse, absent PT pulse bilateral.  Delayed capillary refill bilateral toes.  Sparse digital hair bilateral.  Proximal to distal cooling WNL bilateral.    Dermatological Examination: Interspaces are clear with no open lesions noted bilateral.  Skin is shiny and atrophic bilateral.  Nails are 3-59mm thick, with yellowish/brown discoloration, subungual debris and distal onycholysis x10.  There is pain with compression of nails x10.  There are hyperkeratotic lesions noted right submet 5.  Patient qualifies for at-risk foot care because of diabetes with PVD.  Assessment/Plan: 1. Pain due to onychomycosis of toenails of both feet   2. Callus   3. Type II diabetes mellitus with peripheral circulatory disorder (HCC)    Mycotic nails x10 were sharply debrided with sterile nail nippers and power debriding burr to decrease bulk and length.  Hyperkeratotic lesion right submet 5 was shaved with #312 blade.   Return in about 3 months (around 09/20/2023) for [facility].   Joe Murders, DPM, FACFAS [facility]     [address] [place], [place] [address]                Office [phone]  Fax [phone]

## 2023-06-29 ENCOUNTER — Ambulatory Visit: Payer: Medicare Other | Admitting: Podiatry

## 2023-07-27 ENCOUNTER — Telehealth: Payer: Self-pay

## 2023-07-27 NOTE — Telephone Encounter (Signed)
 DM shoe PU appt made for ASHE.Aaron Aas ppw exp 09/17/23. Shoes added to ASHE bin on 07/27/23

## 2023-07-27 NOTE — Telephone Encounter (Signed)
 LVM to schedule shoe pick up

## 2023-08-08 ENCOUNTER — Ambulatory Visit (INDEPENDENT_AMBULATORY_CARE_PROVIDER_SITE_OTHER)

## 2023-08-08 DIAGNOSIS — L84 Corns and callosities: Secondary | ICD-10-CM | POA: Diagnosis not present

## 2023-08-08 DIAGNOSIS — E1151 Type 2 diabetes mellitus with diabetic peripheral angiopathy without gangrene: Secondary | ICD-10-CM

## 2023-08-08 DIAGNOSIS — I739 Peripheral vascular disease, unspecified: Secondary | ICD-10-CM

## 2023-08-08 DIAGNOSIS — M2042 Other hammer toe(s) (acquired), left foot: Secondary | ICD-10-CM

## 2023-08-08 DIAGNOSIS — M2041 Other hammer toe(s) (acquired), right foot: Secondary | ICD-10-CM

## 2023-08-08 DIAGNOSIS — M2142 Flat foot [pes planus] (acquired), left foot: Secondary | ICD-10-CM

## 2023-08-08 DIAGNOSIS — M2141 Flat foot [pes planus] (acquired), right foot: Secondary | ICD-10-CM

## 2023-08-08 NOTE — Progress Notes (Signed)
 Patient presents today to pick up diabetic shoes and insoles.  Patient was dispensed 1 pair of diabetic shoes and 3 pairs of totoal contact diabetic insoles. Fit was satisfactory. Instructions for break-in and wear was reviewed and a copy was given to the patient.   Re-appointment for regularly scheduled diabetic foot care visits or if they should experience any trouble with the shoes or insoles.

## 2023-09-20 ENCOUNTER — Ambulatory Visit (INDEPENDENT_AMBULATORY_CARE_PROVIDER_SITE_OTHER): Admitting: Podiatry

## 2023-09-20 DIAGNOSIS — L84 Corns and callosities: Secondary | ICD-10-CM | POA: Diagnosis not present

## 2023-09-20 DIAGNOSIS — M79675 Pain in left toe(s): Secondary | ICD-10-CM | POA: Diagnosis not present

## 2023-09-20 DIAGNOSIS — E1151 Type 2 diabetes mellitus with diabetic peripheral angiopathy without gangrene: Secondary | ICD-10-CM

## 2023-09-20 DIAGNOSIS — M79674 Pain in right toe(s): Secondary | ICD-10-CM

## 2023-09-20 DIAGNOSIS — B351 Tinea unguium: Secondary | ICD-10-CM

## 2023-09-20 NOTE — Progress Notes (Signed)
       Subjective:  Patient ID: Sara Chung, female    DOB: 08-10-1975,  MRN: [id number]  Sara Chung presents to clinic today for:  Chief Complaint  Patient presents with   [facility]    Last a!c: 9. No anticoag. Needs nails trimmed and has callus on right foot.    Patient notes nails are thick and elongated, causing pain in shoe gear when ambulating.  She has painful calluses bilateral plantar foot.  She is upset because she was informed her diabetic shoes would be here today and they are not.  She had initially received the wrong color, and is waiting on the exchange.   PCP is Dottie Norleen PHEBE PONCE, MD.  Past Medical History:  Diagnosis Date   Diabetes mellitus without complication (HCC)    Scoliosis    Allergies  Allergen Reactions   Canagliflozin     Other reaction(s): Other (See Comments)  Dysuria and vaganitis  Other Reaction(s): dysuria and vaginitis   Latex     Objective:  Sara Chung is a pleasant 48 y.o. female in NAD. AAO x 3.  Vascular Examination: Patient has palpable DP pulse, absent PT pulse bilateral.  Delayed capillary refill bilateral toes.  Sparse digital hair bilateral.  Proximal to distal cooling WNL bilateral.    Dermatological Examination: Interspaces are clear with no open lesions noted bilateral.  Skin is shiny and atrophic bilateral.  Nails are 3-21mm thick, with yellowish/brown discoloration, subungual debris and distal onycholysis x10.  There is pain with compression of nails x10.  There are hyperkeratotic lesions noted b/l submet 1 and 3.  Patient qualifies for at-risk foot care because of diabetes with PVD .  Assessment/Plan: 1. Pain due to onychomycosis of toenails of both feet   2. Callus   3. Type II diabetes mellitus with peripheral circulatory disorder (HCC)    Mycotic nails x10 were sharply debrided with sterile nail nippers and power debriding burr to decrease bulk and length.  Hyperkeratotic lesions x4 were shaved with #312  blade.  F/u 3 months   Yani Lal DSABRA Imperial, DPM, FACFAS [facility]     [address] [place], [place] [address]                Office [phone]  Fax [phone]

## 2023-10-03 ENCOUNTER — Ambulatory Visit (INDEPENDENT_AMBULATORY_CARE_PROVIDER_SITE_OTHER)

## 2023-10-05 NOTE — Progress Notes (Signed)
 9.5 shoes was too big ordered in 8.5 MD there was no 8 MD avail or 8.5 WD

## 2023-10-10 ENCOUNTER — Telehealth: Payer: Self-pay

## 2023-10-10 NOTE — Telephone Encounter (Signed)
 Shoes are in.. putting in ASHE bin

## 2023-10-17 ENCOUNTER — Ambulatory Visit (INDEPENDENT_AMBULATORY_CARE_PROVIDER_SITE_OTHER)

## 2023-10-17 NOTE — Progress Notes (Signed)
 New shoes were given and fit exchange works well patient was very happy  Lolita Schultze Cped, CFo, CFm

## 2023-12-19 ENCOUNTER — Encounter: Payer: Self-pay | Admitting: Podiatry

## 2023-12-19 ENCOUNTER — Ambulatory Visit: Admitting: Podiatry

## 2023-12-19 DIAGNOSIS — I739 Peripheral vascular disease, unspecified: Secondary | ICD-10-CM

## 2023-12-19 DIAGNOSIS — M79674 Pain in right toe(s): Secondary | ICD-10-CM | POA: Diagnosis not present

## 2023-12-19 DIAGNOSIS — L84 Corns and callosities: Secondary | ICD-10-CM | POA: Diagnosis not present

## 2023-12-19 DIAGNOSIS — E1151 Type 2 diabetes mellitus with diabetic peripheral angiopathy without gangrene: Secondary | ICD-10-CM | POA: Diagnosis not present

## 2023-12-19 DIAGNOSIS — M79675 Pain in left toe(s): Secondary | ICD-10-CM

## 2023-12-19 DIAGNOSIS — B351 Tinea unguium: Secondary | ICD-10-CM | POA: Diagnosis not present

## 2023-12-19 NOTE — Progress Notes (Signed)
 This patient returns to my office for at risk foot care.  This patient requires this care by a professional since this patient will be at risk due to having diabetes, and CKD   This patient is unable to cut nails herself since the patient cannot reach hernails.These nails are painful walking and wearing shoes.  This patient presents for at risk foot care today.  General Appearance  Alert, conversant and in no acute stress.  Vascular  Dorsalis pedis and posterior tibial  pulses are weakly  palpable  bilaterally.  Capillary return is within normal limits  bilaterally. Temperature is within normal limits  bilaterally.  Neurologic  Senn-Weinstein monofilament wire test within normal limits  bilaterally. Muscle power within normal limits bilaterally.  Nails Thick disfigured discolored nails with subungual debris  from hallux to fifth toes bilaterally. No evidence of bacterial infection or drainage bilaterally.  Orthopedic  No limitations of motion  feet .  No crepitus or effusions noted.  No bony pathology or digital deformities noted. Plantar flexed fifth met right foot.  Skin  normotropic skin  noted.  Porokeratosis sub 5th met right foot. No signs of infections or ulcers noted.     Onychomycosis  Pain in right toes  Pain in left toes  Porokeratosis right foot.  Consent was obtained for treatment procedures.   Mechanical debridement of nails 1-5  bilaterally performed with a nail nipper.  Filed with dremel without incident.    Return office visit   3 months                   Told patient to return for periodic foot care and evaluation due to potential at risk complications. Padding dispensed.     Cordella Bold DPM

## 2023-12-20 ENCOUNTER — Ambulatory Visit: Admitting: Podiatry

## 2024-01-05 ENCOUNTER — Ambulatory Visit (INDEPENDENT_AMBULATORY_CARE_PROVIDER_SITE_OTHER): Admitting: Podiatry

## 2024-01-05 DIAGNOSIS — M2041 Other hammer toe(s) (acquired), right foot: Secondary | ICD-10-CM | POA: Diagnosis not present

## 2024-01-05 DIAGNOSIS — M7741 Metatarsalgia, right foot: Secondary | ICD-10-CM

## 2024-01-05 NOTE — Progress Notes (Signed)
  Chief Complaint  Patient presents with   Callouses    Callous care, Dr. Loreda seen her on 10/14, but not sure if he got the callous well enough, right lateran callous and the one on Sub-met 3. Dr. Loreda told them the bone needs to be removed, but that has to be last resort for her.    HPI: 48 y.o. female presents today with pain in the ball of the right foot.  She saw Dr. Loreda on 12/19/2023 for her routine footcare.  She and her sister are not sure if he shaved the calluses well enough and she has significant pain to the areas of calluses today.  Her sister stated that she is not walking well secondary to the pain.  Past Medical History:  Diagnosis Date   Diabetes mellitus without complication (HCC)    Scoliosis    No past surgical history on file. Allergies  Allergen Reactions   Canagliflozin     Other reaction(s): Other (See Comments)  Dysuria and vaganitis  Other Reaction(s): dysuria and vaginitis   Latex     Physical Exam: Palpable pedal pulses.  Pain on palpation to the right plantar forefoot.  There is hyperkeratosis right submet 3 and right submet 5.  The right submet 5 area has the most pain on palpation.  No surrounding erythema is noted.  Slight plantarflexion to the fifth metatarsal is noted.  There is adductovarus of the right fifth toe.  Assessment/Plan of Care: 1. Metatarsalgia, right foot   2. Hammertoe of right foot     The hyperkeratotic lesions were shaved uneventfully with a sterile #313 blade.  She noted immediate improvement upon weightbearing.  This was performed as a courtesy as this would not typically be covered since she just recently had routine footcare performed.  Follow-up as scheduled   Aldeen Riga DSABRA [place], DPM, FACFAS [facility]     [address] [place], [place] [address]                Office [phone]  Fax [phone]

## 2024-03-20 ENCOUNTER — Ambulatory Visit (INDEPENDENT_AMBULATORY_CARE_PROVIDER_SITE_OTHER): Admitting: Podiatry

## 2024-03-20 DIAGNOSIS — B351 Tinea unguium: Secondary | ICD-10-CM

## 2024-03-20 DIAGNOSIS — E1151 Type 2 diabetes mellitus with diabetic peripheral angiopathy without gangrene: Secondary | ICD-10-CM

## 2024-03-20 DIAGNOSIS — L84 Corns and callosities: Secondary | ICD-10-CM

## 2024-03-20 NOTE — Progress Notes (Signed)
" °   °  °  Subjective:  Patient ID: Sara Chung, female    DOB: 1975/11/30,  MRN: [id number]  Sara Chung presents to clinic today for:  Chief Complaint  Patient presents with   [facility]    [facility] with callus A1c was 8.3 in Dec No anti coag   Patient notes nails are thick and elongated, causing pain in shoe gear when ambulating.  She has painful calluses right plantar foot.  The right submet 5 callus is painful.  She will be in her sister's wedding on Valentine's Day next month.  PCP is Nichole Senior, MD.  Last seen 03/08/24.  Past Medical History:  Diagnosis Date   Diabetes mellitus without complication (HCC)    Scoliosis    Allergies  Allergen Reactions   Canagliflozin     Other reaction(s): Other (See Comments)  Dysuria and vaganitis  Other Reaction(s): dysuria and vaginitis   Latex     Objective:  Sara Chung is a pleasant 49 y.o. female in NAD. AAO x 3.  Vascular Examination: Patient has palpable DP pulse, absent PT pulse bilateral.  Delayed capillary refill bilateral toes.  Sparse digital hair bilateral.  Proximal to distal cooling WNL bilateral.    Dermatological Examination: Interspaces are clear with no open lesions noted bilateral.  Skin is shiny and atrophic bilateral.  Nails are 3-24mm thick, with yellowish/brown discoloration, subungual debris and distal onycholysis x10.  There is pain with compression of nails x10.  There are hyperkeratotic lesions right submet 3 and 5.  No ulceration noted  Patient qualifies for at-risk foot care because of diabetes with PVD .  Assessment/Plan: 1. Pain due to onychomycosis of toenails of both feet   2. Callus   3. Type II diabetes mellitus with peripheral circulatory disorder (HCC)    Mycotic nails x10 were sharply debrided with sterile nail nippers and power debriding burr to decrease bulk and length.  Hyperkeratotic lesions x2 were shaved with #312 blade.  F/u 3 months   Sara Chung DSABRA Imperial, DPM, FACFAS [facility]     [address] [place], [place] [address]                Office [phone]  Fax [phone] "

## 2024-05-22 ENCOUNTER — Ambulatory Visit: Admitting: Podiatry
